# Patient Record
Sex: Female | Born: 1985 | Hispanic: Yes | Marital: Single | Smoking: Never smoker
Health system: Southern US, Community
[De-identification: ages and names within clinical notes are randomized; demographics above are authoritative.]

## PROBLEM LIST (undated history)

## (undated) DIAGNOSIS — I729 Aneurysm of unspecified site: Secondary | ICD-10-CM

---

## 2016-11-08 DIAGNOSIS — N8000 Endometriosis of the uterus, unspecified: Secondary | ICD-10-CM | POA: Insufficient documentation

## 2020-05-30 DIAGNOSIS — Z0189 Encounter for other specified special examinations: Secondary | ICD-10-CM | POA: Diagnosis not present

## 2020-05-30 DIAGNOSIS — N800 Endometriosis of uterus: Secondary | ICD-10-CM | POA: Diagnosis not present

## 2020-05-30 DIAGNOSIS — Z124 Encounter for screening for malignant neoplasm of cervix: Secondary | ICD-10-CM | POA: Diagnosis not present

## 2020-08-05 DIAGNOSIS — Z309 Encounter for contraceptive management, unspecified: Secondary | ICD-10-CM | POA: Diagnosis not present

## 2020-08-05 DIAGNOSIS — R102 Pelvic and perineal pain: Secondary | ICD-10-CM | POA: Diagnosis not present

## 2020-08-25 DIAGNOSIS — H5213 Myopia, bilateral: Secondary | ICD-10-CM | POA: Diagnosis not present

## 2020-12-17 ENCOUNTER — Emergency Department (HOSPITAL_COMMUNITY)
Admission: EM | Admit: 2020-12-17 | Discharge: 2020-12-17 | Disposition: A | Discharge status: Home / Self Care | Payer: BC Managed Care – PPO | Attending: Emergency Medicine | Admitting: Emergency Medicine

## 2020-12-17 DIAGNOSIS — M545 Low back pain, unspecified: Secondary | ICD-10-CM | POA: Insufficient documentation

## 2020-12-17 DIAGNOSIS — Y92009 Unspecified place in unspecified non-institutional (private) residence as the place of occurrence of the external cause: Secondary | ICD-10-CM | POA: Insufficient documentation

## 2020-12-17 DIAGNOSIS — X500XXA Overexertion from strenuous movement or load, initial encounter: Secondary | ICD-10-CM | POA: Insufficient documentation

## 2020-12-17 LAB — URINALYSIS, ROUTINE W REFLEX MICROSCOPIC
Bilirubin Urine: NEGATIVE
Glucose, UA: NEGATIVE mg/dL
Hgb urine dipstick: NEGATIVE
Ketones, ur: NEGATIVE mg/dL
Leukocytes,Ua: NEGATIVE
Nitrite: NEGATIVE
Protein, ur: NEGATIVE mg/dL
Specific Gravity, Urine: 1.02 (ref 1.005–1.030)
pH: 6.5 (ref 5.0–8.0)

## 2020-12-17 MED ORDER — TIZANIDINE HCL 2 MG PO CAPS: 2.0000 mg | ORAL_CAPSULE | Freq: Three times a day (TID) | ORAL | 0 refills | Status: DC

## 2020-12-17 MED ORDER — IBUPROFEN 400 MG PO TABS: 400.0000 mg | ORAL_TABLET | Freq: Three times a day (TID) | ORAL | 0 refills | Status: AC

## 2020-12-17 MED ORDER — KETOROLAC TROMETHAMINE 30 MG/ML IJ SOLN
15.0000 mg | Freq: Once | INTRAMUSCULAR | Status: AC
  Administered 2020-12-17: 15 mg via INTRAMUSCULAR
  Filled 2020-12-17: qty 1

## 2020-12-17 MED ORDER — METHYL SALICYLATE-LIDO-MENTHOL 4-4-5 % EX PTCH: 1.0000 | MEDICATED_PATCH | Freq: Two times a day (BID) | CUTANEOUS | 0 refills | Status: DC

## 2020-12-17 NOTE — Discharge Instructions (Signed)
It is important to get plenty of rest, use the provided medication, and monitor your condition.  Do not hesitate to return here, otherwise follow-up with your physician.

## 2020-12-17 NOTE — ED Provider Notes (Signed)
[facility] EMERGENCY DEPARTMENT Provider Note   CSN: [id number] Arrival date & time: 12/17/20  4403     History No chief complaint on file.   Debbie Armstrong is a 35 y.o. female.  HPI Patient presents with back pain.  Pain is across the lower back, left greater than right, has been present for 2+ days.  She notes that the pain began about 48 hours ago, several hours after she was cleaning her house.  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 Since onset pain has been focally lower back, left greater than right, not improved with Tylenol, sore, moderate, worse with activity.  No associated abdominal pain, urinary pattern changes, nausea, vomiting, fever, chills, chest pain, dyspnea. No history of back surgery. Patient has no medical problems, no surgical history, drinks socially, does not smoke.     OB History   No obstetric history on file.     No family history on file.     Home Medications Prior to Admission medications   Not on File    Allergies    Patient has no allergy information on record.  Review of Systems   Review of Systems  Constitutional:       Per HPI, otherwise negative  HENT:       Per HPI, otherwise negative  Respiratory:       Per HPI, otherwise negative  Cardiovascular:       Per HPI, otherwise negative  Gastrointestinal: Negative for vomiting.  Endocrine:       Negative aside from HPI  Genitourinary:       Neg aside from HPI   Musculoskeletal:       Per HPI, otherwise negative  Skin: Negative.   Neurological: Negative for syncope.    Physical Exam Updated Vital Signs BP 118/71 (BP Location: Right Arm)   Pulse (!) 56   Temp 97.8 F (36.6 C) (Oral)   Resp 17   Ht 5\' 8"  (1.727 m)   Wt 90.7 kg   SpO2 99%   BMI 30.41 kg/m   Physical Exam Vitals and nursing note reviewed.  Constitutional:      General: She is not in acute distress.    Appearance:  She is well-developed.  HENT:     Head: Normocephalic and atraumatic.  Eyes:     Conjunctiva/sclera: Conjunctivae normal.  Cardiovascular:     Rate and Rhythm: Normal rate and regular rhythm.  Pulmonary:     Effort: Pulmonary effort is normal. No respiratory distress.     Breath sounds: Normal breath sounds. No stridor.  Abdominal:     General: There is no distension.  Musculoskeletal:       Arms:     Comments: Patient moves all extremity spontaneously, has no referred pain with flexion of either hip against resistance.  Skin:    General: Skin is warm and dry.  Neurological:     Mental Status: She is alert and oriented to person, place, and time.     Cranial Nerves: No cranial nerve deficit.     ED Results / Procedures / Treatments   Labs (all labs ordered are listed, but only abnormal results are displayed) Labs Reviewed  URINALYSIS, ROUTINE W REFLEX MICROSCOPIC    EKG None  Radiology No results found.  Procedures Procedures   Medications Ordered in ED Medications  ketorolac (TORADOL) 30 MG/ML injection 15 mg (has no administration in time range)  ED Course  I have reviewed the triage vital signs and the nursing notes.  Pertinent labs & imaging results that were available during my care of the patient were reviewed by me and considered in my medical decision making (see chart for details).   Urinalysis unremarkable. MDM Rules/Calculators/A&P Adult female presents with 2 days of low back pain, no focal neurologic deficiencies, no red flags suggesting CNS pathology or cauda equina.  She is awake, alert, afebrile.  Patient started on anti-inflammatories here, discharged with similar as well as muscle relaxants, and a work note. Final Clinical Impression(s) / ED Diagnoses Final diagnoses:  Acute bilateral low back pain without sciatica    Rx / DC Orders ED Discharge Orders         Ordered    ibuprofen (ADVIL) 400 MG tablet  3 times daily        12/17/20  1230    tizanidine (ZANAFLEX) 2 MG capsule  3 times daily        12/17/20 1230    Methyl Salicylate-Lido-Menthol 4-4-5 % PTCH  2 times daily        12/17/20 1230           Gerhard Munch, MD 12/17/20 1231

## 2020-12-17 NOTE — ED Triage Notes (Signed)
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

## 2020-12-18 ENCOUNTER — Telehealth: Payer: Self-pay | Admitting: *Deleted

## 2020-12-18 NOTE — Telephone Encounter (Signed)
Transition Care Management Unsuccessful Follow-up Telephone Call  Date of discharge and from where:  12/17/2020 - [facility]  Attempts:  1st Attempt  Reason for unsuccessful TCM follow-up call:  Left voice message 

## 2020-12-19 NOTE — Telephone Encounter (Signed)
Transition Care Management Unsuccessful Follow-up Telephone Call  Date of discharge and from where:  12/17/2020 from [facility]  Attempts:  2nd Attempt  Reason for unsuccessful TCM follow-up call:  Left voice message

## 2020-12-22 NOTE — Telephone Encounter (Signed)
Transition Care Management Unsuccessful Follow-up Telephone Call  Date of discharge and from where:  12/17/2020 from [facility]  Attempts:  3rd Attempt  Reason for unsuccessful TCM follow-up call:  Unable to reach patient

## 2021-11-08 ENCOUNTER — Emergency Department (HOSPITAL_BASED_OUTPATIENT_CLINIC_OR_DEPARTMENT_OTHER)
Admission: EM | Admit: 2021-11-08 | Discharge: 2021-11-08 | Disposition: A | Discharge status: Home / Self Care | Payer: BC Managed Care – PPO | Source: Home / Self Care

## 2021-11-08 ENCOUNTER — Other Ambulatory Visit: Payer: Self-pay

## 2021-11-08 ENCOUNTER — Emergency Department (HOSPITAL_COMMUNITY): Payer: BC Managed Care – PPO

## 2021-11-08 ENCOUNTER — Emergency Department (HOSPITAL_COMMUNITY): Payer: BC Managed Care – PPO | Admitting: Anesthesiology

## 2021-11-08 ENCOUNTER — Inpatient Hospital Stay (HOSPITAL_COMMUNITY)
Admission: EM | Admit: 2021-11-08 | Discharge: 2021-11-25 | DRG: 022 | Disposition: A | Discharge status: Home / Self Care | Payer: BC Managed Care – PPO | Attending: Neurosurgery | Admitting: Neurosurgery

## 2021-11-08 ENCOUNTER — Encounter (HOSPITAL_COMMUNITY)
Admission: EM | Disposition: A | Discharge status: Home / Self Care | Payer: Self-pay | Source: Home / Self Care | Attending: Neurosurgery

## 2021-11-08 ENCOUNTER — Inpatient Hospital Stay (HOSPITAL_COMMUNITY): Payer: BC Managed Care – PPO

## 2021-11-08 DIAGNOSIS — I671 Cerebral aneurysm, nonruptured: Secondary | ICD-10-CM | POA: Diagnosis not present

## 2021-11-08 DIAGNOSIS — Z79899 Other long term (current) drug therapy: Secondary | ICD-10-CM

## 2021-11-08 DIAGNOSIS — I607 Nontraumatic subarachnoid hemorrhage from unspecified intracranial artery: Secondary | ICD-10-CM | POA: Diagnosis not present

## 2021-11-08 DIAGNOSIS — Z5321 Procedure and treatment not carried out due to patient leaving prior to being seen by health care provider: Secondary | ICD-10-CM | POA: Insufficient documentation

## 2021-11-08 DIAGNOSIS — I609 Nontraumatic subarachnoid hemorrhage, unspecified: Secondary | ICD-10-CM | POA: Diagnosis not present

## 2021-11-08 DIAGNOSIS — I10 Essential (primary) hypertension: Secondary | ICD-10-CM | POA: Diagnosis not present

## 2021-11-08 DIAGNOSIS — I615 Nontraumatic intracerebral hemorrhage, intraventricular: Secondary | ICD-10-CM | POA: Diagnosis present

## 2021-11-08 DIAGNOSIS — I602 Nontraumatic subarachnoid hemorrhage from anterior communicating artery: Principal | ICD-10-CM | POA: Diagnosis present

## 2021-11-08 DIAGNOSIS — I608 Other nontraumatic subarachnoid hemorrhage: Secondary | ICD-10-CM

## 2021-11-08 DIAGNOSIS — H5371 Glare sensitivity: Secondary | ICD-10-CM | POA: Insufficient documentation

## 2021-11-08 DIAGNOSIS — R112 Nausea with vomiting, unspecified: Secondary | ICD-10-CM | POA: Insufficient documentation

## 2021-11-08 DIAGNOSIS — G4489 Other headache syndrome: Secondary | ICD-10-CM | POA: Diagnosis not present

## 2021-11-08 DIAGNOSIS — G43909 Migraine, unspecified, not intractable, without status migrainosus: Secondary | ICD-10-CM | POA: Diagnosis not present

## 2021-11-08 DIAGNOSIS — R519 Headache, unspecified: Secondary | ICD-10-CM | POA: Insufficient documentation

## 2021-11-08 HISTORY — PX: IR US GUIDE VASC ACCESS RIGHT: IMG2390

## 2021-11-08 HISTORY — PX: RADIOLOGY WITH ANESTHESIA: SHX6223

## 2021-11-08 HISTORY — PX: IR TRANSCATH/EMBOLIZ: IMG695

## 2021-11-08 HISTORY — PX: IR 3D INDEPENDENT WKST: IMG2385

## 2021-11-08 HISTORY — PX: IR ANGIO INTRA EXTRACRAN SEL INTERNAL CAROTID BILAT MOD SED: IMG5363

## 2021-11-08 HISTORY — PX: IR ANGIOGRAM FOLLOW UP STUDY: IMG697

## 2021-11-08 HISTORY — PX: IR NEURO EACH ADD'L AFTER BASIC UNI RIGHT (MS): IMG5374

## 2021-11-08 HISTORY — PX: IR ANGIO VERTEBRAL SEL VERTEBRAL UNI L MOD SED: IMG5367

## 2021-11-08 LAB — RAPID URINE DRUG SCREEN, HOSP PERFORMED
Amphetamines: NOT DETECTED
Barbiturates: NOT DETECTED
Benzodiazepines: NOT DETECTED
Cocaine: NOT DETECTED
Opiates: NOT DETECTED
Tetrahydrocannabinol: POSITIVE — AB

## 2021-11-08 LAB — DIFFERENTIAL
Abs Immature Granulocytes: 0.09 10*3/uL — ABNORMAL HIGH (ref 0.00–0.07)
Basophils Absolute: 0 10*3/uL (ref 0.0–0.1)
Basophils Relative: 0 %
Eosinophils Absolute: 0 10*3/uL (ref 0.0–0.5)
Eosinophils Relative: 0 %
Immature Granulocytes: 1 %
Lymphocytes Relative: 3 %
Lymphs Abs: 0.6 10*3/uL — ABNORMAL LOW (ref 0.7–4.0)
Monocytes Absolute: 0.2 10*3/uL (ref 0.1–1.0)
Monocytes Relative: 1 %
Neutro Abs: 17.2 10*3/uL — ABNORMAL HIGH (ref 1.7–7.7)
Neutrophils Relative %: 95 %

## 2021-11-08 LAB — CBC
HCT: 41.5 % (ref 36.0–46.0)
HCT: 42.9 % (ref 36.0–46.0)
Hemoglobin: 13.6 g/dL (ref 12.0–15.0)
Hemoglobin: 13.8 g/dL (ref 12.0–15.0)
MCH: 30.8 pg (ref 26.0–34.0)
MCH: 30.5 pg (ref 26.0–34.0)
MCHC: 32.8 g/dL (ref 30.0–36.0)
MCHC: 32.2 g/dL (ref 30.0–36.0)
MCV: 93.9 fL (ref 80.0–100.0)
MCV: 94.7 fL (ref 80.0–100.0)
Platelets: 245 10*3/uL (ref 150–400)
Platelets: 264 10*3/uL (ref 150–400)
RBC: 4.42 MIL/uL (ref 3.87–5.11)
RBC: 4.53 MIL/uL (ref 3.87–5.11)
RDW: 12.1 % (ref 11.5–15.5)
RDW: 12.3 % (ref 11.5–15.5)
WBC: 15.7 10*3/uL — ABNORMAL HIGH (ref 4.0–10.5)
WBC: 18.1 10*3/uL — ABNORMAL HIGH (ref 4.0–10.5)
nRBC: 0 % (ref 0.0–0.2)
nRBC: 0 % (ref 0.0–0.2)

## 2021-11-08 LAB — PROTIME-INR
INR: 1.1 (ref 0.8–1.2)
Prothrombin Time: 13.7 seconds (ref 11.4–15.2)

## 2021-11-08 LAB — BASIC METABOLIC PANEL
Anion gap: 8 (ref 5–15)
BUN: 11 mg/dL (ref 6–20)
CO2: 20 mmol/L — ABNORMAL LOW (ref 22–32)
Calcium: 8.5 mg/dL — ABNORMAL LOW (ref 8.9–10.3)
Chloride: 110 mmol/L (ref 98–111)
Creatinine, Ser: 0.68 mg/dL (ref 0.44–1.00)
GFR, Estimated: >60 mL/min (ref >60)
Glucose, Bld: 115 mg/dL — ABNORMAL HIGH (ref 70–99)
Potassium: 3.2 mmol/L — ABNORMAL LOW (ref 3.5–5.1)
Sodium: 138 mmol/L (ref 135–145)

## 2021-11-08 LAB — I-STAT BETA HCG BLOOD, ED (MC, WL, AP ONLY): I-stat hCG, quantitative: <5 m[IU]/mL (ref <5)

## 2021-11-08 LAB — HIV ANTIBODY (ROUTINE TESTING W REFLEX): HIV Screen 4th Generation wRfx: NONREACTIVE

## 2021-11-08 LAB — APTT: aPTT: 24 seconds (ref 24–36)

## 2021-11-08 SURGERY — RADIOLOGY WITH ANESTHESIA
Anesthesia: General

## 2021-11-08 MED ORDER — ASPIRIN 81 MG PO CHEW
81.0000 mg | CHEWABLE_TABLET | Freq: Every day | ORAL | Status: DC
Start: 2021-11-09 — End: 2021-11-25
  Administered 2021-11-09 – 2021-11-25 (×17): 81 mg via ORAL
  Filled 2021-11-08 (×15): qty 1

## 2021-11-08 MED ORDER — HEPARIN SODIUM (PORCINE) 1000 UNIT/ML IJ SOLN
INTRAMUSCULAR | Status: DC | PRN
  Administered 2021-11-08: 3000 [IU] via INTRAVENOUS
  Administered 2021-11-08: 2000 [IU] via INTRAVENOUS

## 2021-11-08 MED ORDER — NIMODIPINE 30 MG PO CAPS
60.0000 mg | ORAL_CAPSULE | ORAL | Status: DC
  Administered 2021-11-08 – 2021-11-25 (×100): 60 mg via ORAL
  Filled 2021-11-08 (×105): qty 2

## 2021-11-08 MED ORDER — SODIUM CHLORIDE 0.9 % IV BOLUS
500.0000 mL | Freq: Once | INTRAVENOUS | Status: AC
  Administered 2021-11-08: 500 mL via INTRAVENOUS

## 2021-11-08 MED ORDER — LIDOCAINE HCL 1 % IJ SOLN
INTRAMUSCULAR | Status: AC
  Filled 2021-11-08: qty 20

## 2021-11-08 MED ORDER — NITROGLYCERIN 1 MG/10 ML FOR IR/CATH LAB
INTRA_ARTERIAL | Status: AC | PRN
  Administered 2021-11-08: 3 mL via INTRA_ARTERIAL

## 2021-11-08 MED ORDER — DEXAMETHASONE SODIUM PHOSPHATE 10 MG/ML IJ SOLN
INTRAMUSCULAR | Status: DC | PRN
  Administered 2021-11-08: 5 mg via INTRAVENOUS

## 2021-11-08 MED ORDER — ROCURONIUM BROMIDE 10 MG/ML (PF) SYRINGE
PREFILLED_SYRINGE | INTRAVENOUS | Status: DC | PRN
  Administered 2021-11-08: 60 mg via INTRAVENOUS
  Administered 2021-11-08 (×2): 20 mg via INTRAVENOUS

## 2021-11-08 MED ORDER — ACETAMINOPHEN 325 MG PO TABS
650.0000 mg | ORAL_TABLET | ORAL | Status: DC | PRN
Start: 2021-11-08 — End: 2021-11-25
  Administered 2021-11-09 – 2021-11-20 (×22): 650 mg via ORAL
  Filled 2021-11-08 (×22): qty 2

## 2021-11-08 MED ORDER — STROKE: EARLY STAGES OF RECOVERY BOOK
Freq: Once | Status: AC
  Filled 2021-11-08: qty 1

## 2021-11-08 MED ORDER — HEPARIN SODIUM (PORCINE) 1000 UNIT/ML IJ SOLN
INTRAMUSCULAR | Status: AC
  Filled 2021-11-08: qty 10

## 2021-11-08 MED ORDER — ONDANSETRON HCL 4 MG/2ML IJ SOLN
4.0000 mg | Freq: Four times a day (QID) | INTRAMUSCULAR | Status: DC | PRN
  Administered 2021-11-09 – 2021-11-13 (×8): 4 mg via INTRAVENOUS
  Filled 2021-11-08 (×5): qty 2

## 2021-11-08 MED ORDER — SUCCINYLCHOLINE CHLORIDE 200 MG/10ML IV SOSY
PREFILLED_SYRINGE | INTRAVENOUS | Status: DC | PRN
  Administered 2021-11-08: 120 mg via INTRAVENOUS

## 2021-11-08 MED ORDER — IOHEXOL 300 MG/ML  SOLN
100.0000 mL | Freq: Once | INTRAMUSCULAR | Status: AC | PRN
Start: 2021-11-08 — End: 2021-11-08
  Administered 2021-11-08: 100 mL via INTRA_ARTERIAL

## 2021-11-08 MED ORDER — PANTOPRAZOLE SODIUM 40 MG PO TBEC
40.0000 mg | DELAYED_RELEASE_TABLET | Freq: Every day | ORAL | Status: DC
  Administered 2021-11-09 – 2021-11-25 (×17): 40 mg via ORAL
  Filled 2021-11-08 (×17): qty 1

## 2021-11-08 MED ORDER — DIPHENHYDRAMINE HCL 50 MG/ML IJ SOLN
25.0000 mg | Freq: Once | INTRAMUSCULAR | Status: AC
  Administered 2021-11-08: 25 mg via INTRAVENOUS
  Filled 2021-11-08: qty 1

## 2021-11-08 MED ORDER — ASPIRIN 81 MG PO CHEW
81.0000 mg | CHEWABLE_TABLET | Freq: Every day | ORAL | Status: DC
  Filled 2021-11-08 (×2): qty 1

## 2021-11-08 MED ORDER — ACETAMINOPHEN 160 MG/5ML PO SOLN: 650.0000 mg | ORAL | Status: DC | PRN

## 2021-11-08 MED ORDER — LACTATED RINGERS IV SOLN: INTRAVENOUS | Status: DC | PRN

## 2021-11-08 MED ORDER — NITROGLYCERIN 1 MG/10 ML FOR IR/CATH LAB
INTRA_ARTERIAL | Status: AC
  Filled 2021-11-08: qty 10

## 2021-11-08 MED ORDER — SUGAMMADEX SODIUM 200 MG/2ML IV SOLN
INTRAVENOUS | Status: DC | PRN
  Administered 2021-11-08: 50 mg via INTRAVENOUS
  Administered 2021-11-08 (×2): 100 mg via INTRAVENOUS

## 2021-11-08 MED ORDER — CLEVIDIPINE BUTYRATE 0.5 MG/ML IV EMUL
0.0000 mg/h | INTRAVENOUS | Status: DC
  Administered 2021-11-08: 8 mg/h via INTRAVENOUS
  Filled 2021-11-08: qty 50

## 2021-11-08 MED ORDER — FENTANYL CITRATE (PF) 100 MCG/2ML IJ SOLN
INTRAMUSCULAR | Status: AC
  Filled 2021-11-08: qty 2

## 2021-11-08 MED ORDER — ACETAMINOPHEN 650 MG RE SUPP: 650.0000 mg | RECTAL | Status: DC | PRN

## 2021-11-08 MED ORDER — VERAPAMIL HCL 2.5 MG/ML IV SOLN
INTRAVENOUS | Status: AC
Start: 2021-11-08 — End: 2021-11-09
  Filled 2021-11-08: qty 2

## 2021-11-08 MED ORDER — ONDANSETRON HCL 4 MG/2ML IJ SOLN
INTRAMUSCULAR | Status: AC
  Administered 2021-11-08: 4 mg via INTRAVENOUS
  Filled 2021-11-08: qty 2

## 2021-11-08 MED ORDER — ONDANSETRON HCL 4 MG/2ML IJ SOLN
4.0000 mg | Freq: Four times a day (QID) | INTRAMUSCULAR | Status: DC | PRN
  Administered 2021-11-13 (×2): 4 mg via INTRAVENOUS
  Filled 2021-11-08 (×6): qty 2

## 2021-11-08 MED ORDER — PANTOPRAZOLE 2 MG/ML SUSPENSION
40.0000 mg | Freq: Every day | ORAL | Status: DC
  Filled 2021-11-08 (×2): qty 20

## 2021-11-08 MED ORDER — METOCLOPRAMIDE HCL 5 MG/ML IJ SOLN
10.0000 mg | Freq: Once | INTRAMUSCULAR | Status: AC
  Administered 2021-11-08: 10 mg via INTRAVENOUS
  Filled 2021-11-08: qty 2

## 2021-11-08 MED ORDER — ONDANSETRON HCL 4 MG/2ML IJ SOLN: 4.0000 mg | Freq: Once | INTRAMUSCULAR | Status: AC

## 2021-11-08 MED ORDER — NITROGLYCERIN 1 MG/10 ML FOR IR/CATH LAB
INTRA_ARTERIAL | Status: AC | PRN
  Administered 2021-11-08: 200 ug

## 2021-11-08 MED ORDER — ACETAMINOPHEN 325 MG PO TABS: 650.0000 mg | ORAL_TABLET | ORAL | Status: DC | PRN

## 2021-11-08 MED ORDER — ACETAMINOPHEN-CODEINE #3 300-30 MG PO TABS
1.0000 | ORAL_TABLET | ORAL | Status: DC | PRN
  Administered 2021-11-15 – 2021-11-19 (×6): 2 via ORAL
  Filled 2021-11-08 (×6): qty 2

## 2021-11-08 MED ORDER — PROPOFOL 10 MG/ML IV BOLUS
INTRAVENOUS | Status: DC | PRN
  Administered 2021-11-08: 160 mg via INTRAVENOUS
  Administered 2021-11-08: 40 mg via INTRAVENOUS
  Administered 2021-11-08: 50 mg via INTRAVENOUS

## 2021-11-08 MED ORDER — ONDANSETRON HCL 4 MG/2ML IJ SOLN
INTRAMUSCULAR | Status: DC | PRN
  Administered 2021-11-08: 4 mg via INTRAVENOUS

## 2021-11-08 MED ORDER — NIMODIPINE 6 MG/ML PO SOLN
60.0000 mg | ORAL | Status: DC
  Filled 2021-11-08 (×44): qty 10

## 2021-11-08 MED ORDER — SODIUM CHLORIDE 0.9 % IV SOLN: INTRAVENOUS | Status: DC

## 2021-11-08 MED ORDER — IOHEXOL 350 MG/ML SOLN
100.0000 mL | Freq: Once | INTRAVENOUS | Status: AC | PRN
  Administered 2021-11-08: 100 mL via INTRAVENOUS

## 2021-11-08 MED ORDER — CHLORHEXIDINE GLUCONATE CLOTH 2 % EX PADS
6.0000 | MEDICATED_PAD | Freq: Every day | CUTANEOUS | Status: DC
  Administered 2021-11-09 – 2021-11-20 (×11): 6 via TOPICAL

## 2021-11-08 MED ORDER — IOHEXOL 300 MG/ML  SOLN
100.0000 mL | Freq: Once | INTRAMUSCULAR | Status: AC | PRN
  Administered 2021-11-08: 40 mL via INTRA_ARTERIAL

## 2021-11-08 MED ORDER — HYDROMORPHONE HCL 1 MG/ML IJ SOLN
0.5000 mg | INTRAMUSCULAR | Status: DC | PRN
  Administered 2021-11-09 – 2021-11-16 (×3): 0.5 mg via INTRAVENOUS
  Filled 2021-11-08 (×3): qty 1

## 2021-11-08 MED ORDER — CLEVIDIPINE BUTYRATE 0.5 MG/ML IV EMUL
INTRAVENOUS | Status: AC
  Filled 2021-11-08: qty 50

## 2021-11-08 MED ORDER — LABETALOL HCL 5 MG/ML IV SOLN: 20.0000 mg | Freq: Once | INTRAVENOUS | Status: DC

## 2021-11-08 MED ORDER — ONDANSETRON 4 MG PO TBDP: 4.0000 mg | ORAL_TABLET | Freq: Four times a day (QID) | ORAL | Status: DC | PRN

## 2021-11-08 MED ORDER — LIDOCAINE 2% (20 MG/ML) 5 ML SYRINGE
INTRAMUSCULAR | Status: DC | PRN
  Administered 2021-11-08: 60 mg via INTRAVENOUS

## 2021-11-08 MED ORDER — FENTANYL CITRATE (PF) 250 MCG/5ML IJ SOLN
INTRAMUSCULAR | Status: DC | PRN
  Administered 2021-11-08: 100 ug via INTRAVENOUS

## 2021-11-08 MED ORDER — CLEVIDIPINE BUTYRATE 0.5 MG/ML IV EMUL
0.0000 mg/h | INTRAVENOUS | Status: DC
  Administered 2021-11-08: 4 mg/h via INTRAVENOUS

## 2021-11-08 MED ORDER — DOCUSATE SODIUM 100 MG PO CAPS
100.0000 mg | ORAL_CAPSULE | Freq: Two times a day (BID) | ORAL | Status: DC
  Administered 2021-11-09 – 2021-11-24 (×18): 100 mg via ORAL
  Filled 2021-11-08 (×22): qty 1

## 2021-11-08 NOTE — ED Provider Notes (Signed)
?[name] EMERGENCY DEPARTMENT ?Provider Note ? ? ?CSN: [id number] ?Arrival date & time: 11/08/21  1215 ? ?  ? ?History ? ?Chief Complaint  ?Patient presents with  ? Migraine  ? ? ?Debbie Armstrong is a 36 y.o. female. ? ?Patient presents to the emergency department today for evaluation of headache.  Patient states that the headache started around 11 AM.  She states that she just used the restroom and took a hit from a vape when she developed abrupt onset of severe pain on the top of her head which then generalized.  This has been associated with vomiting.  She has had blurry vision but no loss of vision.  Pain radiates into her neck.  No weakness, numbness or tingling in her arms or legs and she has been able to walk.  Patient was brought to [facility] ED by EMS but left before being seen and was transported by a friend to the [facility] emergency department.  She has never had a headache like this before.  She is sensitive light and sound.  No fevers or confusion. ? ? ?  ? ?Home Medications ?Prior to Admission medications   ?Medication Sig Start Date End Date Taking? Authorizing Provider  ?Methyl Salicylate-Lido-Menthol 4-4-5 % PTCH Apply 1 patch topically in the morning and at bedtime. 12/17/20   Gerhard Munch, MD  ?tizanidine (ZANAFLEX) 2 MG capsule Take 1 capsule (2 mg total) by mouth 3 (three) times daily. 12/17/20   Gerhard Munch, MD  ?   ? ?Allergies    ?Patient has no allergy information on record.   ? ?Review of Systems   ?Review of Systems ? ?Physical Exam ?Updated Vital Signs ?BP 131/89   Pulse 72   Temp 97.6 ?F (36.4 ?C) (Oral)   Resp (!) 24   SpO2 100%  ?Physical Exam ?Vitals and nursing note reviewed.  ?Constitutional:   ?   General: She is in acute distress.  ?   Appearance: She is well-developed.  ?   Comments: Patient crying, appears uncomfortable  ?HENT:  ?   Head: Normocephalic and atraumatic.  ?   Right Ear: Tympanic membrane, ear canal and external ear normal.  ?   Left  Ear: Tympanic membrane, ear canal and external ear normal.  ?   Nose: Nose normal.  ?   Mouth/Throat:  ?   Pharynx: Uvula midline.  ?Eyes:  ?   General: Lids are normal.  ?   Extraocular Movements:  ?   Right eye: No nystagmus.  ?   Left eye: No nystagmus.  ?   Conjunctiva/sclera: Conjunctivae normal.  ?   Pupils: Pupils are equal, round, and reactive to light.  ?Cardiovascular:  ?   Rate and Rhythm: Normal rate and regular rhythm.  ?Pulmonary:  ?   Effort: Pulmonary effort is normal.  ?   Breath sounds: Normal breath sounds.  ?Abdominal:  ?   Palpations: Abdomen is soft.  ?   Tenderness: There is no abdominal tenderness.  ?Musculoskeletal:  ?   Cervical back: Normal range of motion and neck supple. No tenderness or bony tenderness.  ?Skin: ?   General: Skin is warm and dry.  ?Neurological:  ?   Mental Status: She is alert and oriented to person, place, and time.  ?   GCS: GCS eye subscore is 4. GCS verbal subscore is 5. GCS motor subscore is 6.  ?   Cranial Nerves: No cranial nerve deficit.  ?   Sensory: No  sensory deficit.  ?   Motor: No weakness.  ?   Gait: Abnormal gait: Deferred due to pain.  ?   Comments: Patient moves her extremities purposefully, no gross upper or lower extremity weakness. ?  ? ? ?ED Results / Procedures / Treatments   ?Labs ?(all labs ordered are listed, but only abnormal results are displayed) ?Labs Reviewed  ?CBC - Abnormal; Notable for the following components:  ?    Result Value  ? WBC 15.7 (*)   ? All other components within normal limits  ?BASIC METABOLIC PANEL  ?I-STAT BETA HCG BLOOD, ED ([facility], [facility], [facility] ONLY)  ? ? ?EKG ?None ? ?Radiology ?CT HEAD WO CONTRAST (5MM) ? ?Result Date: 11/08/2021 ?CLINICAL DATA:  Headache, severe headache and vomiting. EXAM: CT HEAD WITHOUT CONTRAST TECHNIQUE: Contiguous axial images were obtained from the base of the skull through the vertex without intravenous contrast. RADIATION DOSE REDUCTION: This exam was performed according to the departmental  dose-optimization program which includes automated exposure control, adjustment of the mA and/or kV according to patient size and/or use of iterative reconstruction technique. COMPARISON:  None. FINDINGS: Brain: Acute subarachnoid hemorrhage extending from the suprasellar cistern to the bilateral sylvian fissures and along the anterior falx, most dense at the suprasellar cistern suggesting aneurysm rupture. Small amount of additional acute hemorrhage within the third and fourth ventricle. No parenchymal hemorrhage or parenchymal edema is identified. 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 Skull: Normal. Negative for fracture or focal lesion. Sinuses/Orbits: No acute finding. Other: None. IMPRESSION: 1. 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 Small amount of additional acute hemorrhage within the third and fourth ventricle. Recommend immediate neurosurgery consultation and CT angiogram for more definitive characterization of the intracranial vasculature. 2. No parenchymal hemorrhage or parenchymal edema is identified. Critical Value/emergent results were called by telephone at the time of interpretation on 11/08/2021 at 1:28 pm to provider [facility] [place] [facility] , who verbally acknowledged these results. Electronically Signed   By: Bary RichardStan  Maynard M.D.   On: 11/08/2021 13:33   ? ?Procedures ?Procedures  ? ? ?Medications Ordered in ED ?Medications  ?metoCLOPramide (REGLAN) injection 10 mg (10 mg Intravenous Given 11/08/21 1244)  ?diphenhydrAMINE (BENADRYL) injection 25 mg (25 mg Intravenous Given 11/08/21 1244)  ?sodium chloride 0.9 % bolus 500 mL (500 mLs Intravenous New Bag/Given 11/08/21 1243)  ? ? ?ED Course/ Medical Decision Making/ A&P ?  ? ?Patient seen and examined on arrival. History obtained directly from patient and another person at bedside  who provides time of onset.  Patient will need CT imaging of the head to evaluate for subarachnoid hemorrhage given abrupt onset of her symptoms, atypical headache. ? ?Labs/EKG: None ordered ? ?Imaging: CT head without contrast ? ?Medications/Fluids: Ordered: Reglan for headache and vomiting, Benadryl, IV fluid bolus ? ?Most recent vital signs reviewed and are as follows: ?BP 131/89   Pulse 72   Temp 97.6 ?F (36.4 ?C) (Oral)   Resp (!) 24   SpO2 100%  ? ?Initial impression: Headache, no focal neuro findings ? ?1:30 PM Notified by radiology regarding subarachnoid hemorrhage, likely aneurysmal.  ? ?1:35 PM patient updated on results and discussed plan.  On reexam, she is a little drowsy, possibly due to Reglan/Benadryl, but her exam is stable.  No focal motor deficits or sensory deficits.  Cranial nerves remain grossly intact. ? ?1:44 PM Consulted  with Dr. Franky Macho of neurosurgery by telephone. He is aware of patient. Discussed plan to obtain CT angio.  ? ?1:55 PM Pt reassessed. Exam stable.  ? ?2:20 PM Pt rechecked, just started vomiting. Zofran order to RN.  ? ?2:36 PM Vomiting improved, pt resting. Cbc with elevated WBC count noted. BP still controlled.  ? ?BP 134/82   Pulse (!) 58   Temp 97.6 ?F (36.4 ?C) (Oral)   Resp 14   Ht 5\' 6"  (1.676 m)   Wt 93 kg   SpO2 100%   BMI 33.09 kg/m?  ? ?3:42 PM Patient returned from CT. Reviewed results and imaging. Pt reassessed. She is stable, awake and alert, no changes.  ? ?Dr. aware of patient at shift change.  ? ?CRITICAL CARE ?Performed by: Lockie Mola PA-C ?Total critical care time: 55 minutes ?Critical care time was exclusive of separately billable procedures and treating other patients. ?Critical care was necessary to treat or prevent imminent or life-threatening deterioration. ?Critical care was time spent personally by me on the following activities: development of treatment plan with patient and/or surrogate as well as nursing, discussions with  consultants, evaluation of patient's response to treatment, examination of patient, obtaining history from patient or surrogate, ordering and performing treatments and interventions, ordering and review of laboratory studies, orde

## 2021-11-08 NOTE — Anesthesia Procedure Notes (Signed)
Arterial Line Insertion ?Start/End4/04/2022 5:18 PM, 11/08/2021 5:25 PM ?Performed by: Val Eagle, MD, anesthesiologist ? Patient location: [facility] procedure area. ?Preanesthetic checklist: patient identified, IV checked, site marked, risks and benefits discussed, surgical consent, monitors and equipment checked, pre-op evaluation, timeout performed and anesthesia consent ?Lidocaine 1% used for infiltration ?Left, radial was placed ?Catheter size: 20 G ?Hand hygiene performed  and maximum sterile barriers used  ? ?Attempts: 1 ?Procedure performed without using ultrasound guided technique. ?Following insertion, dressing applied and Biopatch. ?Post procedure assessment: normal and unchanged ? ?Patient tolerated the procedure well with no immediate complications. ? ? ?

## 2021-11-08 NOTE — Consult Note (Signed)
? ?Chief Complaint: Patient was seen in consultation today for Subarachnoid hemorrhage. ? ?Referring Physician(s): Coletta Memos, MD. ? ?Supervising Physician: Baldemar Lenis ? ?Patient Status: [facility] - In-pt ? ?History of Present Illness: ?Debbie Armstrong is a 36 y.o. female with no significant past medical history presenting with sudden onset headache that started around 11 am today. She was vaping and felt a strong headache that extended to the neck. She said she felt a bit paralysed for a moment, but this resolved while the headache persisted. Head CT showed SAH in the basal cisterns with 4th ventricle IVH. 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 ? ? ?Allergies: ?Patient has no known allergies. ? ?Medications: ?Prior to Admission medications   ?Medication Sig Start Date End Date Taking? Authorizing Provider  ?Multiple Vitamins-Minerals (HAIR SKIN & NAILS ADVANCED) TABS Take 1 tablet by mouth daily.   Yes [provider]  ?  ? ?No family history on file. ? ?Social History  ? ?Socioeconomic History  ? Marital status: Single  ?  Spouse name: Not on file  ? Number of children: Not on file  ? Years of education: Not on file  ? Highest education level: Not on file  ?Occupational History  ? Not on file  ?Tobacco Use  ? Smoking status: Not on file  ? Smokeless tobacco: Not on file  ?Substance and Sexual Activity  ? Alcohol use: Not on file  ? Drug use: Not on file  ? Sexual activity: Not on file  ?Other Topics Concern  ? Not on file  ?Social History Narrative  ? Not on file  ? ?Social Determinants of Health  ? ?Financial Resource Strain: Not on file  ?Food Insecurity: Not on file  ?Transportation Needs: Not on file  ?Physical Activity: Not on file  ?Stress: Not on file  ?Social Connections: Not on file  ? ? ? ?Review of Systems: A 12 point ROS discussed and pertinent positives are indicated in the HPI above.  All other systems are negative. ? ?Review of Systems ? ?Vital  Signs: ?BP (!) 144/80   Pulse (!) 57   Temp 97.6 ?F (36.4 ?C) (Oral)   Resp 19   Ht 5\' 6"  (1.676 m)   Wt 93 kg   SpO2 100%   BMI 33.09 kg/m?  ? ?Physical Exam ?Constitutional:   ?   General: She is sleeping.  ?HENT:  ?   Head: Normocephalic and atraumatic.  ?Eyes:  ?   Extraocular Movements: Extraocular movements intact.  ?   Pupils: Pupils are equal, round, and reactive to light.  ?Cardiovascular:  ?   Rate and Rhythm: Normal rate and regular rhythm.  ?Pulmonary:  ?   Effort: Pulmonary effort is normal.  ?   Breath sounds: Normal breath sounds.  ?Abdominal:  ?   General: Abdomen is flat.  ?   Palpations: Abdomen is soft.  ?Neurological:  ?   General: No focal deficit present.  ?   Mental Status: She is oriented to person, place, and time and easily aroused.  ? ? ? ?  ? ? ?Imaging: ?CT Angio Head W or Wo Contrast ? ?Result Date: 11/08/2021 ?CLINICAL DATA:  Acute subarachnoid hemorrhage, aneurysmal pattern. EXAM: CT ANGIOGRAPHY HEAD TECHNIQUE: Multidetector CT imaging of the head was performed using the standard protocol during bolus administration of intravenous contrast. Multiplanar CT image reconstructions and MIPs were obtained to evaluate the vascular anatomy. RADIATION DOSE REDUCTION: This exam was performed according to the departmental dose-optimization  program which includes automated exposure control, adjustment of the mA and/or kV according to patient size and/or use of iterative reconstruction technique. CONTRAST:  100mL OMNIPAQUE IOHEXOL 350 MG/ML SOLN COMPARISON:  Head CT earlier same day. FINDINGS: CTA HEAD Anterior circulation: The right cervical internal carotid artery is widely patent through the skull base and siphon regions. 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 It appears that the left cervical ICA supplies the left posterior cerebral artery and may give a tiny  contribution to the left anterior and middle cerebral vessels, but that the majority of the flow to the left anterior circulation comes from the right carotid circulation through the anterior communicating artery complex. There appears to be an irregular aneurysm at the anterior communicating artery, difficult to measure precisely, but probably on the order of 4 mm in size. Posterior circulation: Both vertebral arteries are patent through the foramen magnum to the basilar. The left is dominant. The basilar artery is a small vessel that supplies the superior cerebellar arteries and give some contribution to the right PCA. Minimal if any contribution to the left PCA. Venous sinuses: Patent and normal. Anatomic variants: None other significant. Review of the MIP images confirms the above findings. IMPRESSION: Unusual baseline vascular anatomy. A small left internal carotid artery supplies the left posterior cerebral artery and gives a tiny contribution to the left anterior and middle cerebral vessels. 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 Catheter angiography may be necessary to understand the exact details of this vascular anatomy. Electronically Signed   By: Paulina FusiMark  Shogry M.D.   On: 11/08/2021 15:37  ? ?CT HEAD WO CONTRAST (5MM) ? ?Result Date: 11/08/2021 ?CLINICAL DATA:  Headache, severe headache and vomiting. EXAM: CT HEAD WITHOUT CONTRAST TECHNIQUE: Contiguous axial images were obtained from the base of the skull through the vertex without intravenous contrast. RADIATION DOSE REDUCTION: This exam was performed according to the departmental dose-optimization program which includes automated exposure control, adjustment of the mA and/or kV according to patient size and/or use of iterative reconstruction technique. COMPARISON:  None. FINDINGS: Brain: Acute  subarachnoid hemorrhage extending from the suprasellar cistern to the bilateral sylvian fissures and along the anterior falx, most dense at the suprasellar cistern suggesting aneurysm rupture. Small amount of additional acute hemorrhage within the third and fourth ventricle. No parenchymal hemorrhage or parenchymal edema is identified. 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 Skull: Normal. Negative for fracture or focal lesion. Sinuses/Orbits: No acute finding. Other: None. IMPRESSION: 1. 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 Small amount of additional acute hemorrhage within the third and fourth ventricle. Recommend immediate neurosurgery consultation and CT angiogram for more definitive characterization of the intracranial vasculature. 2. No parenchymal hemorrhage or parenchymal edema is identified. Critical Value/emergent results were called by telephone at the time of interpretation on 11/08/2021 at 1:28 pm to provider [facility] , who verbally acknowledged these results. Electronically Signed   By: Bary RichardStan  Maynard M.D.   On: 11/08/2021 13:33   ? ?Labs: ? ?CBC: ?Recent Labs  ?  11/08/21 ?1334  ?WBC 15.7*  ?HGB 13.6  ?HCT 41.5  ?  PLT 245  ? ? ?COAGS: ?No results for input(s): INR, APTT in the last 8760 hours. ? ?BMP: ?Recent Labs  ?  11/08/21 ?1334  ?NA 138  ?K 3.2*  ?CL 110  ?CO2 20*  ?GLUCOSE 115*  ?BUN 11  ?CALCIUM 8.5*  ?CREATININE 0.68  ?GFRNONAA >60  ? ? ?LIVER FUNCTION TESTS: ?No results for input(s): BILITOT, AST, ALT, ALKPHOS, PROT, ALBUMIN in the last 8760 hours. ? ?TUMOR MARKERS: ?No results for input(s): AFPTM, CEA, CA199, CHROMGRNA in the last 8760 hours. ? ?Assessment and Plan: ? ?36 year old female with a HH1, mFS 2 subarachnoid hemorrhage, likely aneurysmal.  Findings of the CT and CT angiogram were explained to  the patient and her sister.  I explained the risks and benefits of a cerebral angiogram and possible endovascular aneurysm treatment.  I also explained that there is a possibility the patient may need open neuro surgical clipping.  Lowella Dandy

## 2021-11-08 NOTE — Anesthesia Postprocedure Evaluation (Signed)
Anesthesia Post Note ? ?Patient: Debbie Armstrong ? ?Procedure(s) Performed: INTERVENTIONAL RADIOLOGY WITH ANESTHESIA ? ?  ? ?Patient location during evaluation: PACU ?Anesthesia Type: General ?Level of consciousness: awake and alert ?Pain management: pain level controlled ?Vital Signs Assessment: post-procedure vital signs reviewed and stable ?Respiratory status: spontaneous breathing, nonlabored ventilation, respiratory function stable and patient connected to nasal cannula oxygen ?Cardiovascular status: blood pressure returned to baseline and stable ?Postop Assessment: no apparent nausea or vomiting ?Anesthetic complications: no ? ? ?No notable events documented. ? ?Last Vitals:  ?Vitals:  ? 11/08/21 2125 11/08/21 2130  ?BP: 117/82   ?Pulse:  94  ?Resp: 16 16  ?Temp: 37.6 ?C   ?SpO2: 100% 100%  ?  ?Last Pain:  ?Vitals:  ? 11/08/21 2125  ?TempSrc:   ?PainSc: 0-No pain  ? ? ?  ?  ?  ?  ?  ?  ? ?Cassandr Cederberg S ? ? ? ? ?

## 2021-11-08 NOTE — Anesthesia Procedure Notes (Addendum)
Procedure Name: Intubation ?Date/Time: 11/08/2021 5:14 PM ?Performed by: Renato Shin, CRNA ?Pre-anesthesia Checklist: Patient identified, Emergency Drugs available, Suction available, Patient being monitored and Timeout performed ?Patient Re-evaluated:Patient Re-evaluated prior to induction ?Oxygen Delivery Method: Circle system utilized ?Preoxygenation: Pre-oxygenation with 100% oxygen ?Induction Type: IV induction, Rapid sequence and Cricoid Pressure applied ?Laryngoscope Size: Sabra Heck and 3 ?Grade View: Grade I ?Tube type: Oral ?Tube size: 7.0 mm ?Number of attempts: 1 ?Airway Equipment and Method: Stylet ?Placement Confirmation: ETT inserted through vocal cords under direct vision, positive ETCO2 and breath sounds checked- equal and bilateral ?Secured at: 22 cm ?Tube secured with: Tape ?Dental Injury: Teeth and Oropharynx as per pre-operative assessment  ? ? ? ? ?

## 2021-11-08 NOTE — Anesthesia Preprocedure Evaluation (Signed)
Anesthesia Evaluation  ?Patient identified by MRN, date of birth, ID band ?Patient awake ? ? ? ?Reviewed: ?Allergy & Precautions, NPO status , Patient's Chart, lab work & pertinent test results ? ?History of Anesthesia Complications ?Negative for: history of anesthetic complications ? ?Airway ?Mallampati: III ? ?TM Distance: >3 FB ?Neck ROM: Full ? ? ? Dental ? ?(+) Teeth Intact, Dental Advisory Given ?  ?Pulmonary ?Current SmokerPatient did not abstain from smoking.,  ?  ?breath sounds clear to auscultation ? ? ? ? ? ? Cardiovascular ?negative cardio ROS ? ? ?Rhythm:Regular  ? ?  ?Neuro/Psych ? Headaches, SAH ?negative psych ROS  ? GI/Hepatic ?negative GI ROS, Neg liver ROS,   ?Endo/Other  ?negative endocrine ROS ? Renal/GU ?negative Renal ROS  ? ?  ?Musculoskeletal ?negative musculoskeletal ROS ?(+)  ? Abdominal ?  ?Peds ? Hematology ?negative hematology ROS ?(+) Lab Results ?     Component                Value               Date                 ?     WBC                      15.7 (H)            11/08/2021           ?     HGB                      13.6                11/08/2021           ?     HCT                      41.5                11/08/2021           ?     MCV                      93.9                11/08/2021           ?     PLT                      245                 11/08/2021           ?   ?Anesthesia Other Findings ? ? Reproductive/Obstetrics ?Lab Results ?     Component                Value               Date                 ?     HCG                      <5.0                11/08/2021           ? ? ?  ? ? ? ? ? ? ? ? ? ? ? ? ? ?  ?  ? ? ? ? ? ? ? ? ?  Anesthesia Physical ?Anesthesia Plan ? ?ASA: 3 and emergent ? ?Anesthesia Plan: General  ? ?Post-op Pain Management: Minimal or no pain anticipated  ? ?Induction: Intravenous, Rapid sequence and Cricoid pressure planned ? ?PONV Risk Score and Plan: 3 and Ondansetron and Dexamethasone ? ?Airway Management Planned: Oral  ETT ? ?Additional Equipment: Arterial line ? ?Intra-op Plan:  ? ?Post-operative Plan: Extubation in OR and Possible Post-op intubation/ventilation ? ?Informed Consent: I have reviewed the patients History and Physical, chart, labs and discussed the procedure including the risks, benefits and alternatives for the proposed anesthesia with the patient or authorized representative who has indicated his/her understanding and acceptance.  ? ? ? ?Dental advisory given ? ?Plan Discussed with: CRNA ? ?Anesthesia Plan Comments:   ? ? ? ? ? ? ?Anesthesia Quick Evaluation ? ?

## 2021-11-08 NOTE — Procedures (Signed)
INTERVENTIONAL NEURORADIOLOGY BRIEF POSTPROCEDURE NOTE ? ?DIAGNOSTIC CEREBRAL ANGIOGRAM AND ENDOVASCULAR ANEURYSM EMBOLIZATION  ?  ?Attending: Dr. Baldemar Lenis ?  ?Diagnosis: Aneurysmal subarachnoid hemorrhage  ?  ?Access site: Distal right radial artery  ?  ?Access closure: Inflatable band  ?  ?Anesthesia: General endotracheal anesthesia  ?  ?Medication used: Refer to anesthesia documentation. ?  ?Complications: None  ?  ?Estimated blood loss: None  ?  ?Specimen: None  ?  ?Findings:  ?  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  Coil embolization performed of each aneurysm.  ?  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  However, given pattern of bleed and is smooth configuration, this was thought to not be the cause of bleeding. ?  ?The left internal carotid artery ends at the communicating segment with the left ACA and MCA supplied by the right ICA via a prominent anterior communicating artery.  A small fenestration is also noted in the anterior communicating artery.  ?  ?No aneurysm of the left anterior circulation or posterior circulation identified.  ?  ?The patient tolerated the procedure well without incident or complication and is in stable condition. ? ?  ?

## 2021-11-08 NOTE — Transfer of Care (Signed)
Immediate Anesthesia Transfer of Care Note ? ?Patient: Debbie Armstrong ? ?Procedure(s) Performed: INTERVENTIONAL RADIOLOGY WITH ANESTHESIA ? ?Patient Location: PACU ? ?Anesthesia Type:General ? ?Level of Consciousness: awake, alert  and drowsy ? ?Airway & Oxygen Therapy: Patient Spontanous Breathing and Patient connected to face mask oxygen ? ?Post-op Assessment: Report given to RN and Post -op Vital signs reviewed and stable ? ?Post vital signs: Reviewed and stable ? ?Last Vitals:  ?Vitals Value Taken Time  ?BP 120/70 11/08/21 2138  ?Temp    ?Pulse 91 11/08/21 2135  ?Resp 16 11/08/21 2141  ?SpO2 100 % 11/08/21 2135  ?Vitals shown include unvalidated device data. ? ?Last Pain:  ?Vitals:  ? 11/08/21 1226  ?TempSrc: Oral  ?   ? ?  ? ?Complications: No notable events documented. ?

## 2021-11-08 NOTE — Progress Notes (Incomplete)
Pacu RN Report to floor given ? ?Gave report to  [employer]. Room: 4N18. Discussed surgery, meds given in OR and Pacu, VS, IV fluids given, EBL, urine output, pain and other pertinent information. Also discussed if pt had any family or friends here or belongings with them.  ? ?Pt has a TR band to R radial wrist, 6cc initially, removed 2, 4cc air remaining. No bleeding noted. +CSM.  ?Foley cath in place, 200 out. L radial aline is 30 points higher than cuff.  ?T max 99.7. Neuro intact, good strength in all extremities.  ? ? ? ? ?Pt exits my care.   ?

## 2021-11-08 NOTE — ED Triage Notes (Signed)
Pt arrived POV to triage complaining of sudden onset headache that started about 1hr ago. Pt having light and sound sensitivity. Pt also complaining of N/V. ? ?Pt denies trauma to head. Denies hx of headaches ? ?Pain is 10/10 on both sides of head and down back of neck.  ? ? ?

## 2021-11-08 NOTE — ED Triage Notes (Signed)
Pt arrived via [facility] from home. Per EMS, pt called for migraine. Pt reports she used a vape which is normal for her and began having pain in the left side of her head which has been constant for approx 30 min. Pt had one episode of vomiting with EMS and still c/o nausea. Pt denies hx of migraine. Pt also reports sensitivity to light and vision being slightly blurry. Pt caox4. ?

## 2021-11-08 NOTE — H&P (Signed)
Debbie Armstrong is an 36 y.o. female.   ?Chief Complaint: headache ?HPI: when at approximately 1100 today complained of a sudden and severe headache after using the bathroom. She had been vaping prior also. The headache caused her to call ems and she was taken to [facility]. She left before being seen, her friend then brought her to the [facility] ED. Head CT showed an SAH. By report she was light and sound sensitive. 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  ?Her exam has remained very good and stable. ?No past medical history on file. ? ? ? ? ?Results for orders placed or performed during the hospital encounter of 11/08/21 (from the past 48 hour(s))  ?CBC     Status: Abnormal  ? Collection Time: 11/08/21  1:34 PM  ?Result Value Ref Range  ? WBC 15.7 (H) 4.0 - 10.5 K/uL  ? RBC 4.42 3.87 - 5.11 MIL/uL  ? Hemoglobin 13.6 12.0 - 15.0 g/dL  ? HCT 41.5 36.0 - 46.0 %  ? MCV 93.9 80.0 - 100.0 fL  ? MCH 30.8 26.0 - 34.0 pg  ? MCHC 32.8 30.0 - 36.0 g/dL  ? RDW 12.1 11.5 - 15.5 %  ? Platelets 245 150 - 400 K/uL  ? nRBC 0.0 0.0 - 0.2 %  ?  Comment: Performed at [facility], [address], [place], [place] [address]  ?Basic metabolic panel     Status: Abnormal  ? Collection Time: 11/08/21  1:34 PM  ?Result Value Ref Range  ? Sodium 138 135 - 145 mmol/L  ? Potassium 3.2 (L) 3.5 - 5.1 mmol/L  ? Chloride 110 98 - 111 mmol/L  ? CO2 20 (L) 22 - 32 mmol/L  ? Glucose, Bld 115 (H) 70 - 99 mg/dL  ?  Comment: Glucose reference range applies only to samples taken after fasting for at least 8 hours.  ? BUN 11 6 - 20 mg/dL  ? Creatinine, Ser 0.68 0.44 - 1.00 mg/dL  ? Calcium 8.5 (L) 8.9 - 10.3 mg/dL  ? GFR, Estimated >60 >60 mL/min  ?  Comment: (NOTE) ?Calculated using the CKD-EPI Creatinine Equation (2021) ?  ? Anion gap 8 5 - 15  ?  Comment: Performed at [facility], [address], [place], [place] [address]  ?I-Stat Beta hCG blood, ED ([facility], [facility], [facility] only)     Status: None  ? Collection Time: 11/08/21  1:44 PM  ?Result Value Ref  Range  ? I-stat hCG, quantitative <5.0 <5 mIU/mL  ? Comment 3          ?  Comment:   GEST. AGE      CONC.  (mIU/mL) ?  <=1 WEEK        5 - 50 ?    2 WEEKS       50 - 500 ?    3 WEEKS       100 - 10,000 ?    4 WEEKS     1,000 - 30,000 ?       ?FEMALE AND NON-PREGNANT FEMALE: ?    LESS THAN 5 mIU/mL ?  ? ?CT Angio Head W or Wo Contrast ? ?Result Date: 11/08/2021 ?CLINICAL DATA:  Acute subarachnoid hemorrhage, aneurysmal pattern. EXAM: CT ANGIOGRAPHY HEAD TECHNIQUE: Multidetector CT imaging of the head was performed using the standard protocol during bolus administration of intravenous contrast. Multiplanar CT image reconstructions and MIPs were obtained to evaluate the vascular anatomy. RADIATION DOSE REDUCTION: This exam was  performed according to the departmental dose-optimization program which includes automated exposure control, adjustment of the mA and/or kV according to patient size and/or use of iterative reconstruction technique. CONTRAST:  OMNIPAQUE IOHEXOL 350 MG/ML SOLN COMPARISON:  Head CT earlier same day. FINDINGS: CTA HEAD Anterior circulation: The right cervical internal carotid artery is widely patent through the skull base and siphon regions. 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 It appears that the left cervical ICA supplies the left posterior cerebral artery and may give a tiny contribution to the left anterior and middle cerebral vessels, but that the majority of the flow to the left anterior circulation comes from the right carotid circulation through the anterior communicating artery complex. There appears to be an irregular aneurysm at the anterior communicating artery, difficult to measure precisely, but probably on the order of 4 mm in size. Posterior circulation: Both vertebral arteries are patent through the foramen magnum to the basilar. The left is dominant. The  basilar artery is a small vessel that supplies the superior cerebellar arteries and give some contribution to the right PCA. Minimal if any contribution to the left PCA. Venous sinuses: Patent and normal. Anatomic variants: None other significant. Review of the MIP images confirms the above findings. IMPRESSION: Unusual baseline vascular anatomy. A small left internal carotid artery supplies the left posterior cerebral artery and gives a tiny contribution to the left anterior and middle cerebral vessels. 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 Catheter angiography may be necessary to understand the exact details of this vascular anatomy. Electronically Signed   By: Paulina Fusi M.D.   On: 11/08/2021 15:37  ? ?CT HEAD WO CONTRAST ( ) ? ?Result Date: 11/08/2021 ?CLINICAL DATA:  Headache, severe headache and vomiting. EXAM: CT HEAD WITHOUT CONTRAST TECHNIQUE: Contiguous axial images were obtained from the base of the skull through the vertex without intravenous contrast. RADIATION DOSE REDUCTION: This exam was performed according to the departmental dose-optimization program which includes automated exposure control, adjustment of the mA and/or kV according to patient size and/or use of iterative reconstruction technique. COMPARISON:  None. FINDINGS: Brain: Acute subarachnoid hemorrhage extending from the suprasellar cistern to the bilateral sylvian fissures and along the anterior falx, most dense at the suprasellar cistern suggesting aneurysm rupture. Small amount of additional acute hemorrhage within the third and fourth ventricle. No parenchymal hemorrhage or parenchymal edema is identified. 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 Skull: Normal. Negative for fracture or focal lesion. Sinuses/Orbits: No acute finding. Other: None.  IMPRESSION: 1. 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 Small amount of additional acute hemorrhage within the third and fourth ventricle. Recommend immediate neurosurgery consultation and CT angiogram for more definitive characterization of the intracranial vasculature. 2. No parenchymal hemorrhage or parenchymal edema is identified. Critical Value/emergent results were called by telephone at the time of interpretation on 11/08/2021 at 1:28 pm to provider [facility] , who verbally acknowledged these results. Electronically Signed   By: Bary Richard M.D.   On: 11/08/2021 13:33   ? ?Review of Systems  ?Constitutional: Negative.   ?HENT: Negative.    ?Eyes:  Negative.   ?Respiratory: Negative.    ?Cardiovascular: Negative.   ?Gastrointestinal:  Positive for vomiting.  ?Endocrine: Negative.   ?Genitourinary: Negative.   ?Musculoskeletal:  Positive for neck pain and neck stiffness.  ?Skin: Negative.   ?Neurological:  Positive for headaches.  ?     Headache  ?Psychiatric/Behavioral: Negative.    ? ?Blood pressure (!) 138/96, pulse 71, temperature 97.6 ?F (36.4 ?C), temperature source Oral, resp. rate 12, height  (1.676 m), weight 93 kg, SpO2 100 %. ?Physical Exam ?Constitutional:   ?   Appearance: She is ill-appearing.  ?HENT:  ?   Head: Normocephalic and atraumatic.  ?   Right Ear: External ear normal.  ?   Left Ear: External ear normal.  ?   Nose: Nose normal.  ?   Mouth/Throat:  ?   Mouth: Mucous membranes are moist.  ?   Pharynx: Oropharynx is clear.  ?Eyes:  ?   Extraocular Movements: Extraocular movements intact.  ?   Conjunctiva/sclera: Conjunctivae normal.  ?   Pupils: Pupils are equal, round, and reactive to light.  ?Cardiovascular:  ?   Rate and Rhythm: Normal rate and regular rhythm.  ?Pulmonary:  ?   Effort: Pulmonary effort is normal.  ?Abdominal:  ?    General: Abdomen is flat.  ?   Palpations: Abdomen is soft.  ?Musculoskeletal:     ?   General: Normal range of motion.  ?   Cervical back: Rigidity present.  ?Skin: ?   General: Skin is warm and dry.  ?N

## 2021-11-09 ENCOUNTER — Inpatient Hospital Stay (HOSPITAL_COMMUNITY): Payer: BC Managed Care – PPO

## 2021-11-09 ENCOUNTER — Encounter (HOSPITAL_COMMUNITY): Payer: Self-pay | Admitting: Radiology

## 2021-11-09 DIAGNOSIS — I671 Cerebral aneurysm, nonruptured: Secondary | ICD-10-CM

## 2021-11-09 LAB — ABO/RH: ABO/RH(D): O POS

## 2021-11-09 MED ORDER — OXYCODONE HCL 5 MG PO TABS
5.0000 mg | ORAL_TABLET | ORAL | Status: DC | PRN
  Administered 2021-11-09 – 2021-11-10 (×4): 10 mg via ORAL
  Administered 2021-11-11 (×3): 5 mg via ORAL
  Administered 2021-11-11 – 2021-11-13 (×10): 10 mg via ORAL
  Administered 2021-11-14: 5 mg via ORAL
  Administered 2021-11-16 (×2): 10 mg via ORAL
  Filled 2021-11-09 (×3): qty 2
  Filled 2021-11-09: qty 1
  Filled 2021-11-09: qty 2
  Filled 2021-11-09: qty 1
  Filled 2021-11-09: qty 2
  Filled 2021-11-09: qty 1
  Filled 2021-11-09 (×2): qty 2
  Filled 2021-11-09: qty 1
  Filled 2021-11-09 (×9): qty 2

## 2021-11-09 MED ORDER — PROCHLORPERAZINE 25 MG RE SUPP
25.0000 mg | Freq: Two times a day (BID) | RECTAL | Status: DC | PRN
  Filled 2021-11-09: qty 1

## 2021-11-09 MED ORDER — DEXAMETHASONE 4 MG PO TABS
4.0000 mg | ORAL_TABLET | Freq: Four times a day (QID) | ORAL | Status: AC
  Administered 2021-11-09 – 2021-11-10 (×4): 4 mg via ORAL
  Filled 2021-11-09 (×4): qty 1

## 2021-11-09 NOTE — Progress Notes (Signed)
? ? ?Supervising Physician: Baldemar Lenise Macedo Rodrigues, [place] ? ?Patient [facility]:  [facility] ? ?Chief Complaint: ? ?Aneurysmal subarachnoid hemorrhage 1 day s/p coil embolization of 3 aneurysms. ? ?Subjective: ? ?Headache overnight, but now resolved. No complaints ? ? ?Allergies: ?Patient has no known allergies. ? ?Medications: ?Prior to Admission medications   ?Medication Sig Start Date End Date Taking? Authorizing Provider  ?Multiple Vitamins-Minerals (HAIR SKIN & NAILS ADVANCED) TABS Take 1 tablet by mouth daily.   Yes [provider]  ? ? ? ?Vital Signs: ?BP 102/77   Pulse (!) 43   Temp 97.9 ?F (36.6 ?C) (Oral)   Resp 13   Ht 5\' 6"  (1.676 m)   Wt 205 lb (93 kg)   SpO2 95%   BMI 33.09 kg/m?  ? ?Physical Exam ?Constitutional:   ?   General: She is not in acute distress. ?HENT:  ?   Head: Normocephalic and atraumatic.  ?   Mouth/Throat:  ?   Mouth: Mucous membranes are moist.  ?   Pharynx: Oropharynx is clear.  ?Eyes:  ?   Extraocular Movements: Extraocular movements intact.  ?   Conjunctiva/sclera: Conjunctivae normal.  ?Cardiovascular:  ?   Rate and Rhythm: Normal rate and regular rhythm.  ?Pulmonary:  ?   Effort: Pulmonary effort is normal. No respiratory distress.  ?Neurological:  ?   General: No focal deficit present.  ?   Mental Status: She is alert and oriented to person, place, and time. Mental status is at baseline.  ?   Cranial Nerves: No cranial nerve deficit.  ?   Sensory: No sensory deficit.  ?Psychiatric:     ?   Mood and Affect: Mood normal.  ? ? ?Imaging: ?CT Angio Head W or Wo Contrast ? ?Result Date: 11/08/2021 ?CLINICAL DATA:  Acute subarachnoid hemorrhage, aneurysmal pattern. EXAM: CT ANGIOGRAPHY HEAD TECHNIQUE: Multidetector CT imaging of the head was performed using the standard protocol during bolus administration of intravenous contrast. Multiplanar CT image reconstructions and MIPs were obtained to evaluate the vascular anatomy. RADIATION DOSE REDUCTION: This exam was performed  according to the departmental dose-optimization program which includes automated exposure control, adjustment of the mA and/or kV according to patient size and/or use of iterative reconstruction technique. CONTRAST:  100mL OMNIPAQUE IOHEXOL 350 MG/ML SOLN COMPARISON:  Head CT earlier same day. FINDINGS: CTA HEAD Anterior circulation: The right cervical internal carotid artery is widely patent through the skull base and siphon regions. 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 It appears that the left cervical ICA supplies the left posterior cerebral artery and may give a tiny contribution to the left anterior and middle cerebral vessels, but that the majority of the flow to the left anterior circulation comes from the right carotid circulation through the anterior communicating artery complex. There appears to be an irregular aneurysm at the anterior communicating artery, difficult to measure precisely, but probably on the order of 4 mm in size. Posterior circulation: Both vertebral arteries are patent through the foramen magnum to the basilar. The left is dominant. The basilar artery is a small vessel that supplies the superior cerebellar arteries and give some contribution to the right PCA. Minimal if any contribution to the left PCA. Venous sinuses: Patent and normal. Anatomic variants: None other significant. Review of the MIP images confirms the above findings. IMPRESSION: Unusual baseline vascular anatomy. A small  left internal carotid artery supplies the left posterior cerebral artery and gives a tiny contribution to the left anterior and middle cerebral vessels. 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 Catheter angiography may be necessary  to understand the exact details of this vascular anatomy. Electronically Signed   By: Paulina Fusi M.D.   On: 11/08/2021 15:37  ? ?CT HEAD WO CONTRAST ( ) ? ?Result Date: 11/09/2021 ?CLINICAL DATA:  36 year old female presenting with aneurysmal rupture pattern of subarachnoid hemorrhage yesterday. 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 3 mm paraophthalmic aneurysm. EXAM: CT HEAD WITHOUT CONTRAST TECHNIQUE: Contiguous axial images were obtained from the base of the skull through the vertex without intravenous contrast. RADIATION DOSE REDUCTION: This exam was performed according to the departmental dose-optimization program which includes automated exposure control, adjustment of the mA and/or kV according to patient size and/or use of iterative reconstruction technique. COMPARISON:  Presentation head CT 11/08/2021. FINDINGS: Brain: Aneurysmal pattern subarachnoid hemorrhage stable since yesterday, relatively sparing the cisterna magna and convexities. Small volume of IVH layering now in the lateral ventricles. No ventriculomegaly. No midline shift. No cortically based acute infarct identified. Stable gray-white matter differentiation throughout the brain. Unusual configuration of the anterior 3rd ventricle and suprasellar cistern, as well as prominent scattered dural calcifications. Some of the suprasellar cistern anatomy might be obscured by isodense blood also. Vascular: Multiple small anterior communicating artery region aneurysm coil packs now. No suspicious intracranial vascular hyperdensity. Skull: No acute osseous abnormality identified. Sinuses/Orbits: Visualized paranasal sinuses and mastoids are stable and well aerated. Other: Negative orbit and scalp soft tissues. IMPRESSION: 1. Stable Aneurysmal pattern SAH since presentation. Trace IVH with no ventriculomegaly or significant intracranial mass effect. 2. Several small  new anterior communicating artery region endovascular coil packs. 3. No new intracranial abnormality. Electronically Signed   By: Odessa Fleming M.D.   On: 11/09/2021 05:20  ? ?CT HEAD WO CONTRAST ( ) ? ?Result Date: 11/08/2021 ?CLINICAL DATA:  Headache, severe headache and vomiting. EXAM: CT HEAD WITHOUT CONTRAST TECHNIQUE: Contiguous axial images were obtained from the base of the skull through the vertex without intravenous contrast. RADIATION DOSE REDUCTION: This exam was performed according to the departmental dose-optimization program which includes automated exposure control, adjustment of the mA and/or kV according to patient size and/or use of iterative reconstruction technique. COMPARISON:  None. FINDINGS: Brain: Acute subarachnoid hemorrhage extending from the suprasellar cistern to the bilateral sylvian fissures and along the anterior falx, most dense at the suprasellar cistern suggesting aneurysm rupture. Small amount of additional acute hemorrhage within the third and fourth ventricle. No parenchymal hemorrhage or parenchymal edema is identified. 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 Skull: Normal. Negative for fracture or focal lesion. Sinuses/Orbits: No acute finding. Other: None. IMPRESSION: 1. 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 Small amount of additional acute hemorrhage within the third and fourth ventricle. Recommend immediate neurosurgery consultation and CT angiogram for more definitive characterization of the intracranial vasculature. 2. No parenchymal hemorrhage or parenchymal edema is identified. Critical Value/emergent results were called by telephone at the time of interpretation on 11/08/2021 at 1:28 pm to provider [facility] , who verbally acknowledged these results. Electronically Signed  By: Bary Richard M.D.   On: 11/08/2021 13:33   ? ?Labs: ? ?CBC: ?Recent Labs  ?  11/08/21 ?1334 11/08/21 ?1720  ?WBC 15.7* 18.1*  ?HGB 13.6 13.8  ?HCT 41.5 42.9  ?PLT 245 264  ? ? ?COAGS: ?Recent Labs  ?  11/08/21 ?1626

## 2021-11-09 NOTE — Progress Notes (Signed)
Transcranial Doppler ? ?Date POD PCO2 HCT BP  MCA ACA PCA OPHT SIPH VERT Basilar  ?4/10 ?RH     Right  ?Left  ? *  ?*  ? *  ?*  ? *  ?*  ? 26  ?22  ? 27  ?26  ? -34  ?-21  ? -44  ?  ?  ?     Right  ?Left  ?   ?  ?   ?  ?   ?  ?   ?  ?   ?  ?   ?  ?   ?  ?  ?     Right  ?Left  ?   ?  ?   ?  ?   ?  ?   ?  ?   ?  ?   ?  ?   ?  ?  ? ?     Right  ?Left  ?   ?  ?   ?  ?   ?  ?   ?  ?   ?  ?   ?  ?   ?  ?  ? ?     Right  ?Left  ?   ?  ?   ?  ?   ?  ?   ?  ?   ?  ?   ?  ?   ?  ?  ?     Right  ?Left  ?   ?  ?   ?  ?   ?  ?   ?  ?   ?  ?   ?  ?   ?  ?  ?     Right  ?Left  ?   ?  ?   ?  ?   ?  ?   ?  ?   ?  ?   ?  ?   ?  ?  ? ?MCA = Middle Cerebral Artery      OPHT = Opthalmic Artery     BASILAR = Basilar Artery   ?ACA = Anterior Cerebral Artery     SIPH = Carotid Siphon ?PCA = Posterior Cerebral Artery   VERT = Verterbral Artery                  ? ?Normal ?MCA = 62+\-12 ?ACA = 50+\-12 ?PCA = 42+\-23  ? ?*= Unable to insonate ? ?Jean Rosenthal, RDMS, RVT ? ? ? ?

## 2021-11-09 NOTE — Progress Notes (Signed)
Patient ID: Debbie Armstrong, female   DOB: Dec 02, 1985, 36 y.o.   MRN: [id number] ?BP 123/70   Pulse 77   Temp 98.8 ?F (37.1 ?C) (Oral)   Resp 10   Ht 5\' 6"  (1.676 m)   Wt 93 kg   SpO2 99%   BMI 33.09 kg/m?  ?Alert and oriented x 4 ?Speech is clear and lfuent ?Moving all extremities well ?Comfortable at this time ?Continue hydration. ?

## 2021-11-09 NOTE — Progress Notes (Signed)
At 0400, pt vomited x2 with a a minor headache 3/10. ? ?At 0430, pt vomiting again and saying this headache is "the worst its ever been 10/10" ? ?Stat CT head obtained, notified MD Cabbell. Orders for compazine and oxy.Will continue to monitor. ?

## 2021-11-09 NOTE — Progress Notes (Signed)
Patient ID: Debbie Armstrong, female   DOB: 1986/07/20, 36 y.o.   MRN: [id number] ?BP 122/60   Pulse (!) 43   Temp 99 ?F (37.2 ?C) (Oral)   Resp 17   Ht 5\' 6"  (1.676 m)   Wt 93 kg   SpO2 95%   BMI 33.09 kg/m?  ?Head CT reviewed, no evidence of rebleeding. Headache may be due to inflammation of just coiled aneurysms, will try decadron.  ?

## 2021-11-09 NOTE — Progress Notes (Signed)
?  Transition of Care (TOC) Screening Note ? ? ?Patient Details  ?Name: Debbie Armstrong ?Date of Birth: 1985-12-22 ? ? ?Transition of Care (TOC) CM/SW Contact:    ?Mearl Latin, LCSW ?Phone Number: ?11/09/2021, 1:25 PM ? ? ? ?Transition of Care Department [facility]) has reviewed patient and no TOC needs have been identified at this time. We will continue to monitor patient advancement through interdisciplinary progression rounds. If new patient transition needs arise, please place a TOC consult. ? ? ?

## 2021-11-10 ENCOUNTER — Other Ambulatory Visit: Payer: Self-pay | Admitting: Physician Assistant

## 2021-11-10 DIAGNOSIS — I609 Nontraumatic subarachnoid hemorrhage, unspecified: Secondary | ICD-10-CM

## 2021-11-10 NOTE — Progress Notes (Signed)
? ? ?Referring Physician(s): ?Coletta MemosKyle Cabbell ? ?Supervising Physician: Baldemar Lenise Macedo Rodrigues, [place] ? ?[facility]  [facility] ? ?Chief Complaint: ? ?Headache/ SAH ? ?Brief History: ? ?Debbie Armstrong is a 36 y.o. female with no significant past medical history who presented to the ED yesterday after sudden onset headache that started around 11 am yesterday. ? ?CT showed SAH in the basal cisterns with 4th ventricle IVH.  ? 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 ? ?She underwent coiling of 3 ACA aneurysms yesterday by Dr. Tommie Samsde Macedo Rodrigues. ? 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  A small fenestration is also noted in the anterior communicating artery. ? ?Subjective: ? ?Doing well this morning. Still a little headache at the back of her head. She did take medication for it and it has eased off. ? ?Allergies: ?Patient has no known allergies. ? ?Medications: ?Prior to Admission medications   ?Medication Sig Start Date End Date Taking? Authorizing Provider  ?Multiple Vitamins-Minerals (HAIR SKIN & NAILS ADVANCED) TABS Take 1 tablet by mouth daily.   Yes [provider]  ? ? ? ?Vital Signs: ?BP 125/72   Pulse 66   Temp 98.2 ?F (36.8 ?C) (Oral)   Resp 16   Ht 5\' 6"  (1.676 m)   Wt 205 lb (93 kg)   SpO2 99%   BMI 33.09 kg/m?  ? ?Physical Exam ?Vitals reviewed.  ?Constitutional:   ?   Appearance: Normal appearance.  ?Cardiovascular:  ?   Rate and Rhythm: Normal rate and regular rhythm.  ?Pulmonary:  ?   Effort: Pulmonary effort is normal. No respiratory distress.  ?Musculoskeletal:     ?   General: Normal range of motion.  ?   Comments: Radial stick site ok. No hematoma  ?Skin: ?   General: Skin is warm and dry.  ?Neurological:  ?   General: No focal deficit present.  ?   Mental Status: She is alert and oriented to person, place, and time.   ?Psychiatric:     ?   Mood and Affect: Mood normal.     ?   Behavior: Behavior normal.     ?   Thought Content: Thought content normal.     ?   Judgment: Judgment normal.  ? ? ?Imaging: ?CT Angio Head W or Wo Contrast ? ?Result Date: 11/08/2021 ?CLINICAL DATA:  Acute subarachnoid hemorrhage, aneurysmal pattern. EXAM: CT ANGIOGRAPHY HEAD TECHNIQUE: Multidetector CT imaging of the head was performed using the standard protocol during bolus administration of intravenous contrast. Multiplanar CT image reconstructions and MIPs were obtained to evaluate the vascular anatomy. RADIATION DOSE REDUCTION: This exam was performed according to the departmental dose-optimization program which includes automated exposure control, adjustment of the mA and/or kV according to patient size and/or use of iterative reconstruction technique. CONTRAST:  100mL OMNIPAQUE IOHEXOL 350 MG/ML SOLN COMPARISON:  Head CT earlier same day. FINDINGS: CTA HEAD Anterior circulation: The right cervical internal carotid artery is widely patent through the skull base and siphon regions. 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 It appears that the left cervical ICA supplies the left posterior cerebral artery and may give a tiny contribution to the left anterior and middle cerebral vessels, but that the majority  of the flow to the left anterior circulation comes from the right carotid circulation through the anterior communicating artery complex. There appears to be an irregular aneurysm at the anterior communicating artery, difficult to measure precisely, but probably on the order of 4 mm in size. Posterior circulation: Both vertebral arteries are patent through the foramen magnum to the basilar. The left is dominant. The basilar artery is a small vessel that supplies the superior cerebellar arteries and give some contribution to the  right PCA. Minimal if any contribution to the left PCA. Venous sinuses: Patent and normal. Anatomic variants: None other significant. Review of the MIP images confirms the above findings. IMPRESSION: Unusual baseline vascular anatomy. A small left internal carotid artery supplies the left posterior cerebral artery and gives a tiny contribution to the left anterior and middle cerebral vessels. 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 Catheter angiography may be necessary to understand the exact details of this vascular anatomy. Electronically Signed   By: Paulina Fusi M.D.   On: 11/08/2021 15:37  ? ?CT HEAD WO CONTRAST ( ) ? ?Result Date: 11/09/2021 ?CLINICAL DATA:  36 year old female presenting with aneurysmal rupture pattern of subarachnoid hemorrhage yesterday. 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 3 mm paraophthalmic aneurysm. EXAM: CT HEAD WITHOUT CONTRAST TECHNIQUE: Contiguous axial images were obtained from the base of the skull through the vertex without intravenous contrast. RADIATION DOSE REDUCTION: This exam was performed according to the departmental dose-optimization program which includes automated exposure control, adjustment of the mA and/or kV according to patient size and/or use of iterative reconstruction technique. COMPARISON:  Presentation head CT 11/08/2021. FINDINGS: Brain: Aneurysmal pattern subarachnoid hemorrhage stable since yesterday, relatively sparing the cisterna magna and convexities. Small volume of IVH layering now in the lateral ventricles. No ventriculomegaly. No midline shift. No cortically based acute infarct identified. Stable gray-white matter differentiation throughout the brain. Unusual configuration of the anterior 3rd ventricle and  suprasellar cistern, as well as prominent scattered dural calcifications. Some of the suprasellar cistern anatomy might be obscured by isodense blood also. Vascular: Multiple small anterior communicating artery region aneurysm coil packs now. No suspicious intracranial vascular hyperdensity. Skull: No acute osseous abnormality identified. Sinuses/Orbits: Visualized paranasal sinuses and mastoids are stable and well aerated. Other: Negative orbit and scalp soft tissues. IMPRESSION: 1. Stable Aneurysmal pattern SAH since presentation. Trace IVH with no ventriculomegaly or significant intracranial mass effect. 2. Several small new anterior communicating artery region endovascular coil packs. 3. No new intracranial abnormality. Electronically Signed   By: Odessa Fleming M.D.   On: 11/09/2021 05:20  ? ?CT HEAD WO CONTRAST ( ) ? ?Result Date: 11/08/2021 ?CLINICAL DATA:  Headache, severe headache and vomiting. EXAM: CT HEAD WITHOUT CONTRAST TECHNIQUE: Contiguous axial images were obtained from the base of the skull through the vertex without intravenous contrast. RADIATION DOSE REDUCTION: This exam was performed according to the departmental dose-optimization program which includes automated exposure control, adjustment of the mA and/or kV according to patient size and/or use of iterative reconstruction technique. COMPARISON:  None. FINDINGS: Brain: Acute subarachnoid hemorrhage extending from the suprasellar cistern to the bilateral sylvian fissures and along the anterior falx, most dense at the suprasellar cistern suggesting aneurysm rupture. Small amount of additional acute hemorrhage within the third and fourth ventricle. No parenchymal hemorrhage or parenchymal edema is identified. Vascular:  Presumed ruptured aneurysm at the suprasellar cistern, as above. Skull: Normal. Negative for fracture or focal lesion. Sinuses/Orbits: No acute finding. Other: None. IMPRESSION: 1. Evidence of aneurysm rupture at the suprasellar cistern  with acute subarachnoid hemorrhage extending from the suprasellar cistern to the bilateral Sylvian fissures and along the anterior falx. Small amount of additional acute hemorrhage within the third and fourth ventric

## 2021-11-10 NOTE — Progress Notes (Signed)
Patient ID: Debbie Armstrong, female   DOB: [birth year], 36 y.o.   MRN: [id number] ?BP (!) 127/100   Pulse 62   Temp 98 ?F (36.7 ?C) (Oral)   Resp 20   Ht 5\' 6"  (1.676 m)   Wt 93 kg   SpO2 99%   BMI 33.09 kg/m?  ?Alert and oriented x 4 ?Speech is clear and fluent, perrl ?Moving all extremities well ?No headache at this time ?Doing well ?

## 2021-11-10 NOTE — TOC CAGE-AID Note (Signed)
Transition of Care (TOC) - CAGE-AID Screening ? ? ?Patient Details  ?Name: Debbie Armstrong ?MRN: [id number] ?Date of Birth: 08/17/1985 ? ?Transition of Care (TOC) CM/SW Contact:    ?Aubriel Khanna C Tarpley-Carter, LCSWA ?Phone Number: ?11/10/2021, 2:43 PM ? ? ?Clinical Narrative: ?Pt participated in Cage-Aid.  Pt stated she does use marijuana.  Pt was offered resources, due to usage of substance.    ?   ?Insurance underwriter, MSW, LCSW-A ?Pronouns:  She/Her/Hers ?[facility] ?Clinical Social Worker ?Direct Number:  [phone] ?[email or web address] ? ?CAGE-AID Screening: ?  ? ?Have You Ever Felt You Ought to Cut Down on Your Drinking or Drug Use?: No ?Have People Annoyed You By Critizing Your Drinking Or Drug Use?: No ?Have You Felt Bad Or Guilty About Your Drinking Or Drug Use?: No ?Have You Ever Had a Drink or Used Drugs First Thing In The Morning to Steady Your Nerves or to Get Rid of a Hangover?: No ?CAGE-AID Score: 0 ? ?Substance Abuse Education Offered: Yes ? ?Substance abuse interventions: Educational Materials ? ? ? ? ? ? ?

## 2021-11-11 ENCOUNTER — Inpatient Hospital Stay (HOSPITAL_COMMUNITY): Payer: BC Managed Care – PPO

## 2021-11-11 DIAGNOSIS — I671 Cerebral aneurysm, nonruptured: Secondary | ICD-10-CM

## 2021-11-11 NOTE — Progress Notes (Signed)
? ? ?Referring Physician(s): ?Dr. Coletta Memos ? ?Supervising Physician: Baldemar Lenis ? ?Patient Status:  [facility] - In-pt ? ?Chief Complaint: ?Subarachnoid hemorrhage ? ?Subjective: ?Sitting up in chair.  ?Reports headache, heaviness, and nausea today as expected 4 days post hemorrhage.  ?No weakness, focal deficits.  ?Working to improve appetite.  ? ?Allergies: ?Patient has no known allergies. ? ?Medications: ?Prior to Admission medications   ?Medication Sig Start Date End Date Taking? Authorizing Provider  ?Multiple Vitamins-Minerals (HAIR SKIN & NAILS ADVANCED) TABS Take 1 tablet by mouth daily.   Yes [provider]  ? ? ? ?Vital Signs: ?BP 114/70   Pulse (!) 58   Temp 98.4 ?F (36.9 ?C) (Oral)   Resp 15   Ht 5\' 6"  (1.676 m)   Wt 205 lb (93 kg)   SpO2 99%   BMI 33.09 kg/m?  ? ?Physical Exam ?NAD, alert ?Neuro: alert, oriented, conversant, speech clear, moving all extremities.  ? ?Imaging: ?CT Angio Head W or Wo Contrast ? ?Result Date: 11/08/2021 ?CLINICAL DATA:  Acute subarachnoid hemorrhage, aneurysmal pattern. EXAM: CT ANGIOGRAPHY HEAD TECHNIQUE: Multidetector CT imaging of the head was performed using the standard protocol during bolus administration of intravenous contrast. Multiplanar CT image reconstructions and MIPs were obtained to evaluate the vascular anatomy. RADIATION DOSE REDUCTION: This exam was performed according to the departmental dose-optimization program which includes automated exposure control, adjustment of the mA and/or kV according to patient size and/or use of iterative reconstruction technique. CONTRAST:  01/08/2022 OMNIPAQUE IOHEXOL 350 MG/ML SOLN COMPARISON:  Head CT earlier same day. FINDINGS: CTA HEAD Anterior circulation: The right cervical internal carotid artery is widely patent through the skull base and siphon regions. The left cervical internal carotid artery is a small vessel, probably congenital based on the fact that the carotid canal is smaller on  the left than the right. It appears that the left cervical ICA supplies the left posterior cerebral artery and may give a tiny contribution to the left anterior and middle cerebral vessels, but that the majority of the flow to the left anterior circulation comes from the right carotid circulation through the anterior communicating artery complex. There appears to be an irregular aneurysm at the anterior communicating artery, difficult to measure precisely, but probably on the order of 4 mm in size. Posterior circulation: Both vertebral arteries are patent through the foramen magnum to the basilar. The left is dominant. The basilar artery is a small vessel that supplies the superior cerebellar arteries and give some contribution to the right PCA. Minimal if any contribution to the left PCA. Venous sinuses: Patent and normal. Anatomic variants: None other significant. Review of the MIP images confirms the above findings. IMPRESSION: Unusual baseline vascular anatomy. A small left internal carotid artery supplies the left posterior cerebral artery and gives a tiny contribution to the left anterior and middle cerebral vessels. 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 Catheter angiography may be necessary to understand the exact details of this vascular anatomy. Electronically Signed   By: M.D.   On: 11/08/2021 15:37  ? ?CT HEAD WO CONTRAST (01/08/2022) ? ?Result Date: 11/09/2021 ?CLINICAL DATA:  36 year old female presenting with aneurysmal rupture pattern of subarachnoid hemorrhage yesterday. 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 3 mm paraophthalmic aneurysm. EXAM: CT HEAD WITHOUT  CONTRAST TECHNIQUE: Contiguous axial images were obtained from the base of the skull through the  vertex without intravenous contrast. RADIATION DOSE REDUCTION: This exam was performed according to the departmental dose-optimization program which includes automated exposure control, adjustment of the mA and/or kV according to patient size and/or use of iterative reconstruction technique. COMPARISON:  Presentation head CT 11/08/2021. FINDINGS: Brain: Aneurysmal pattern subarachnoid hemorrhage stable since yesterday, relatively sparing the cisterna magna and convexities. Small volume of IVH layering now in the lateral ventricles. No ventriculomegaly. No midline shift. No cortically based acute infarct identified. Stable gray-white matter differentiation throughout the brain. Unusual configuration of the anterior 3rd ventricle and suprasellar cistern, as well as prominent scattered dural calcifications. Some of the suprasellar cistern anatomy might be obscured by isodense blood also. Vascular: Multiple small anterior communicating artery region aneurysm coil packs now. No suspicious intracranial vascular hyperdensity. Skull: No acute osseous abnormality identified. Sinuses/Orbits: Visualized paranasal sinuses and mastoids are stable and well aerated. Other: Negative orbit and scalp soft tissues. IMPRESSION: 1. Stable Aneurysmal pattern SAH since presentation. Trace IVH with no ventriculomegaly or significant intracranial mass effect. 2. Several small new anterior communicating artery region endovascular coil packs. 3. No new intracranial abnormality. Electronically Signed   By: Odessa Fleming M.D.   On: 11/09/2021 05:20  ? ?CT HEAD WO CONTRAST ( ) ? ?Result Date: 11/08/2021 ?CLINICAL DATA:  Headache, severe headache and vomiting. EXAM: CT HEAD WITHOUT CONTRAST TECHNIQUE: Contiguous axial images were obtained from the base of the skull through the vertex without intravenous contrast. RADIATION DOSE REDUCTION: This exam was performed according to the departmental dose-optimization program which includes automated exposure  control, adjustment of the mA and/or kV according to patient size and/or use of iterative reconstruction technique. COMPARISON:  None. FINDINGS: Brain: Acute subarachnoid hemorrhage extending from the suprasellar cistern to the bilateral sylvian fissures and along the anterior falx, most dense at the suprasellar cistern suggesting aneurysm rupture. Small amount of additional acute hemorrhage within the third and fourth ventricle. No parenchymal hemorrhage or parenchymal edema is identified. 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 Skull: Normal. Negative for fracture or focal lesion. Sinuses/Orbits: No acute finding. Other: None. IMPRESSION: 1. 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 Small amount of additional acute hemorrhage within the third and fourth ventricle. Recommend immediate neurosurgery consultation and CT angiogram for more definitive characterization of the intracranial vasculature. 2. No parenchymal hemorrhage or parenchymal edema is identified. Critical Value/emergent results were called by telephone at the time of interpretation on 11/08/2021 at 1:28 pm to provider [facility] , who verbally acknowledged these results. Electronically Signed   By: Bary Richard M.D.   On: 11/08/2021 13:33  ? ?IR Transcath/Emboliz ? ?Result Date: 11/09/2021 ?INDICATION: 36 year old female presents the onset headache and neck pain on 11/08/2021 around 11 a.m. Head CT showed subarachnoid hemorrhage in the basal cisterns with small hematoma at the level of the anterior interhemispheric fissure. CT angiogram of the head was suggestive of an anterior communicating artery aneurysm. EXAM: ULTRASOUND-GUIDED VASCULAR ACCESS DIAGNOSTIC CEREBRAL ANGIOGRAM 3D ROTATIONAL ANGIOGRAM ENDOVASCULAR ANEURYSM EMBOLIZATION FLAT PANEL HEAD CT COMPARISON:  CT  angiogram of the head November 08, 2021. MEDICATIONS: No antibiotics utilized. ANESTHESIA/SEDATION: The procedure was performed under general anesthesia. CONTRAST:  136 mL of Omnipaque 300 mg/mL FLUOROSCOPY: Radiat

## 2021-11-11 NOTE — Progress Notes (Incomplete)
{  Select Note:[id number]} 

## 2021-11-11 NOTE — Progress Notes (Signed)
Transcranial Doppler ? ?Date POD PCO2 HCT BP  MCA ACA PCA OPHT SIPH VERT Basilar  ?4/10 ?RH     Right  ?Left  ? *  ?*  ? *  ?*  ? *  ?*  ? 26  ?22  ? 27  ?26  ? -34  ?-21  ? -44  ?  ?  ?4/12 ?RH     Right  ?Left  ? *  ?*  ? *  ?*  ? *  ?*  ? 20  ?21  ? 25  ?27  ? -40  ?-27  ? -60  ?  ?  ?     Right  ?Left  ?   ?  ?   ?  ?   ?  ?   ?  ?   ?  ?   ?  ?   ?  ?  ? ?     Right  ?Left  ?   ?  ?   ?  ?   ?  ?   ?  ?   ?  ?   ?  ?   ?  ?  ? ?     Right  ?Left  ?   ?  ?   ?  ?   ?  ?   ?  ?   ?  ?   ?  ?   ?  ?  ?     Right  ?Left  ?   ?  ?   ?  ?   ?  ?   ?  ?   ?  ?   ?  ?   ?  ?  ?     Right  ?Left  ?   ?  ?   ?  ?   ?  ?   ?  ?   ?  ?   ?  ?   ?  ?  ? ?MCA = Middle Cerebral Artery      OPHT = Opthalmic Artery     BASILAR = Basilar Artery   ?ACA = Anterior Cerebral Artery     SIPH = Carotid Siphon ?PCA = Posterior Cerebral Artery   VERT = Verterbral Artery                  ? ?Normal ?MCA = 62+\-12 ?ACA = 50+\-12 ?PCA = 42+\-23  ? ?*= Unable to insonate ? ?Jean Rosenthal, RDMS, RVT ? ? ? ?

## 2021-11-11 NOTE — Progress Notes (Signed)
Patient ID: Debbie Armstrong, female   DOB: 03/12/1986, 36 y.o.   MRN: [id number] ?Alert and oriented ?Nauseated, no emesis ?Moving all extremities well ?Perrl, full eom ?

## 2021-11-12 MED ORDER — DIAZEPAM 5 MG PO TABS
5.0000 mg | ORAL_TABLET | Freq: Four times a day (QID) | ORAL | Status: DC | PRN
  Administered 2021-11-12 – 2021-11-16 (×3): 5 mg via ORAL
  Filled 2021-11-12 (×3): qty 1

## 2021-11-12 NOTE — Progress Notes (Signed)
? ? ?Referring Physician(s): Dr. Ashok Pall ? ?Supervising Physician: Pedro Earls ? ?Patient Status:  [facility] - In-pt ? ?Chief Complaint: ? 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 Karenann Cai on 11/08/21.  ? ?Subjective: ? ?Patient laying in bed, NAD. RN and family member at the bedside.  ?States she is doing "so-so" today, that she had of little of headache last night but it was not bad.  ?No N/V.  ?Patient's main complaint this morning is the soreness in her thighs, not sure there it is coming from but thinks maybe because she is laying in bed all the time.  ?RN states that patient does not have PT/OT eval order but she had been able to walk in her room well.  ? ?Allergies: ?Patient has no known allergies. ? ?Medications: ?Prior to Admission medications   ?Medication Sig Start Date End Date Taking? Authorizing Provider  ?Multiple Vitamins-Minerals (HAIR SKIN & NAILS ADVANCED) TABS Take 1 tablet by mouth daily.   Yes [provider]  ? ? ? ?Vital Signs: ?BP 116/68   Pulse (!) 59   Temp 98.4 ?F (36.9 ?C) (Oral)   Resp 18   Ht 5\' 6"  (1.676 m)   Wt 205 lb (93 kg)   SpO2 100%   BMI 33.09 kg/m?  ? ?Physical Exam ?Vitals reviewed.  ?Constitutional:   ?   General: She is not in acute distress. ?   Appearance: Normal appearance. She is not ill-appearing.  ?Cardiovascular:  ?   Rate and Rhythm: Normal rate and regular rhythm.  ?Pulmonary:  ?   Effort: Pulmonary effort is normal.  ?Skin: ?   General: Skin is warm and dry.  ?Neurological:  ?   Mental Status: She is alert and oriented to person, place, and time.  ?Psychiatric:     ?   Mood and Affect: Mood normal.     ?   Behavior: Behavior normal.  ? ? ?Imaging: ?CT Angio Head W or Wo Contrast ? ?Result Date: 11/08/2021 ?CLINICAL DATA:  Acute subarachnoid hemorrhage, aneurysmal pattern. EXAM: CT ANGIOGRAPHY HEAD TECHNIQUE: Multidetector CT imaging of the  head was performed using the standard protocol during bolus administration of intravenous contrast. Multiplanar CT image reconstructions and MIPs were obtained to evaluate the vascular anatomy. RADIATION DOSE REDUCTION: This exam was performed according to the departmental dose-optimization program which includes automated exposure control, adjustment of the mA and/or kV according to patient size and/or use of iterative reconstruction technique. CONTRAST:  118mL OMNIPAQUE IOHEXOL 350 MG/ML SOLN COMPARISON:  Head CT earlier same day. FINDINGS: CTA HEAD Anterior circulation: The right cervical internal carotid artery is widely patent through the skull base and siphon regions. 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 It appears that the left cervical ICA supplies the left posterior cerebral artery and may give a tiny contribution to the left anterior and middle cerebral vessels, but that the majority of the flow to the left anterior circulation comes from the right carotid circulation through the anterior communicating artery complex. There appears to be an irregular aneurysm at the anterior communicating artery, difficult to measure precisely, but probably on the order of 4 mm in size. Posterior circulation: Both vertebral arteries are patent through the foramen magnum to the basilar. The left is dominant. The basilar artery is a small  vessel that supplies the superior cerebellar arteries and give some contribution to the right PCA. Minimal if any contribution to the left PCA. Venous sinuses: Patent and normal. Anatomic variants: None other significant. Review of the MIP images confirms the above findings. IMPRESSION: Unusual baseline vascular anatomy. A small left internal carotid artery supplies the left posterior cerebral artery and gives a tiny contribution to the left anterior and middle  cerebral vessels. 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 Catheter angiography may be necessary to understand the exact details of this vascular anatomy. Electronically Signed   By: Nelson Chimes M.D.   On: 11/08/2021 15:37  ? ?CT HEAD WO CONTRAST (5MM) ? ?Result Date: 11/09/2021 ?CLINICAL DATA:  36 year old female presenting with aneurysmal rupture pattern of subarachnoid hemorrhage yesterday. 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 3 mm paraophthalmic aneurysm. EXAM: CT HEAD WITHOUT CONTRAST TECHNIQUE: Contiguous axial images were obtained from the base of the skull through the vertex without intravenous contrast. RADIATION DOSE REDUCTION: This exam was performed according to the departmental dose-optimization program which includes automated exposure control, adjustment of the mA and/or kV according to patient size and/or use of iterative reconstruction technique. COMPARISON:  Presentation head CT 11/08/2021. FINDINGS: Brain: Aneurysmal pattern subarachnoid hemorrhage stable since yesterday, relatively sparing the cisterna magna and convexities. Small volume of IVH layering now in the lateral ventricles. No ventriculomegaly. No midline shift. No cortically based acute infarct identified. Stable gray-white matter differentiation throughout the brain. Unusual configuration of the anterior 3rd ventricle and suprasellar cistern, as well as prominent scattered dural calcifications. Some of the suprasellar cistern anatomy might be obscured by isodense blood also. Vascular: Multiple small anterior communicating artery region aneurysm coil packs now. No suspicious intracranial vascular hyperdensity. Skull: No acute osseous abnormality identified. Sinuses/Orbits: Visualized paranasal  sinuses and mastoids are stable and well aerated. Other: Negative orbit and scalp soft tissues. IMPRESSION: 1. Stable Aneurysmal pattern SAH since presentation. Trace IVH with no ventriculomegaly or significant intracranial mass effect. 2. Several small new anterior communicating artery region endovascular coil packs. 3. No new intracranial abnormality. Electronically Signed   By: Genevie Ann M.D.   On: 11/09/2021 05:20  ? ?CT HEAD WO CONTRAST (5MM) ? ?Result Date: 11/08/2021 ?CLINICAL DATA:  Headache, severe headache and vomiting. EXAM: CT HEAD WITHOUT CONTRAST TECHNIQUE: Contiguous axial images were obtained from the base of the skull through the vertex without intravenous contrast. RADIATION DOSE REDUCTION: This exam was performed according to the departmental dose-optimization program which includes automated exposure control, adjustment of the mA and/or kV according to patient size and/or use of iterative reconstruction technique. COMPARISON:  None. FINDINGS: Brain: Acute subarachnoid hemorrhage extending from the suprasellar cistern to the bilateral sylvian fissures and along the anterior falx, most dense at the suprasellar cistern suggesting aneurysm rupture. Small amount of additional acute hemorrhage within the third and fourth ventricle. No parenchymal hemorrhage or parenchymal edema is identified. 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 Skull: Normal. Negative for fracture or focal lesion. Sinuses/Orbits: No acute finding. Other: None. IMPRESSION: 1. 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 Small amount of additional acute hemorrhage within the third and fourth ventricle. Recommend immediate neurosurgery consultation and CT  angiogram for more definitive characterization of the intracranial vasculature. 2. No parenchymal  hemorrhage or parenchymal edema is identified. Critical Value/emergent results were called by telephone at the time of interpretation on 11/08/2021 at 1:28 pm to provider [facility] , who verbally acknowledged these results. Ele

## 2021-11-12 NOTE — Progress Notes (Signed)
Patient ID: Debbie Armstrong, female   DOB: [birth year], 36 y.o.   MRN: [id number] ?BP 119/76   Pulse 60   Temp 99.1 ?F (37.3 ?C) (Oral)   Resp 16   Ht 5\' 6"  (1.676 m)   Wt 93 kg   SpO2 100%   BMI 33.09 kg/m?  ?Alert and oriented x4 ?Complaining of leg heaviness, discomfort. Given valiium ?Alert and oriented x 4, speech is clear and fluent ?Moving all extremities ?

## 2021-11-13 ENCOUNTER — Inpatient Hospital Stay (HOSPITAL_COMMUNITY): Payer: BC Managed Care – PPO

## 2021-11-13 DIAGNOSIS — I671 Cerebral aneurysm, nonruptured: Secondary | ICD-10-CM

## 2021-11-13 NOTE — Progress Notes (Addendum)
Transcranial Doppler ? ?Date POD PCO2 HCT BP  MCA ACA PCA OPHT SIPH VERT Basilar  ?4/10 ?RH     Right  ?Left  ? *  ?*  ? *  ?*  ? *  ?*  ? 26  ?22  ? 27  ?26  ? -34  ?-21  ? -44  ?  ?  ?4/12 ?RH     Right  ?Left  ? *  ?*  ? *  ?*  ? *  ?*  ? 20  ?21  ? 25  ?27  ? -40  ?-27  ? -60  ?  ?  ?4/14 MS     Right  ?Left  ? 32  ?*  ? *  ?*  ? *  ?*  ? 17  ?14  ? 64  ?28  ? -36  ?-15  ? -47  ?  ?  ?4/17 MS     Right  ?Left  ? 44  ?*  ? *  ?*  ? *  ?*  ? 19  ?25  ? 25  ?33  ? -28  ?-34  ? -69  ?  ?  ? ?     Right  ?Left  ?   ?  ?   ?  ?   ?  ?   ?  ?   ?  ?   ?  ?   ?  ?  ?     Right  ?Left  ?   ?  ?   ?  ?   ?  ?   ?  ?   ?  ?   ?  ?   ?  ?  ?     Right  ?Left  ?   ?  ?   ?  ?   ?  ?   ?  ?   ?  ?   ?  ?   ?  ?  ? ?MCA = Middle Cerebral Artery      OPHT = Opthalmic Artery     BASILAR = Basilar Artery   ?ACA = Anterior Cerebral Artery     SIPH = Carotid Siphon ?PCA = Posterior Cerebral Artery   VERT = Verterbral Artery                  ? ?Normal ?MCA = 62+\-12 ?ACA = 50+\-12 ?PCA = 42+\-23  ? ?*= Unable to insonate ? ?11/16/21 Right Lindegaard ratio= 1.16, unable to calculate left due to inability to insonate left MCA. ? ?11/16/2021 3:07 PM ?Gertie Fey, MHA, RVT, RDCS, RDMS  ? ? ? ?

## 2021-11-13 NOTE — Progress Notes (Signed)
Patient ID: Debbie Armstrong, female   DOB: [birth year], 36 y.o.   MRN: [id number] ?BP 124/74   Pulse (!) 55   Temp 98.8 ?F (37.1 ?C) (Axillary)   Resp 15   Ht 5\' 6"  (1.676 m)   Wt 93 kg   SpO2 99%   BMI 33.09 kg/m?  ?Alert, oriented ?Moving all extremities ?Perrl, full eom ?Symmetric facies, tongue and uvula midline ?Headaches remain, may have improved slightly ?Continue current measures.  ?

## 2021-11-13 NOTE — Plan of Care (Signed)
?  Problem: Education: ?Goal: Knowledge of disease or condition will improve ?Outcome: Progressing ?  ?Problem: Coping: ?Goal: Will verbalize positive feelings about self ?Outcome: Progressing ?  ?Problem: Health Behavior/Discharge Planning: ?Goal: Ability to manage health-related needs will improve ?Outcome: Progressing ?  ?Problem: Self-Care: ?Goal: Ability to participate in self-care as condition permits will improve ?Outcome: Progressing ?  ?Problem: Nutrition: ?Goal: Risk of aspiration will decrease ?Outcome: Progressing ?  ?Problem: Spontaneous Subarachnoid Hemorrhage Tissue Perfusion: ?Goal: Complications of Spontaneous Subarachnoid Hemorrhage will be minimized ?Outcome: Progressing ?  ?

## 2021-11-13 NOTE — Progress Notes (Signed)
? ? ?Referring Physician(s): ?Dr. Coletta Memos ? ?Supervising Physician: Baldemar Lenis ? ?Patient Status:  [facility] - In-pt ? ?Chief Complaint: ?Subarachnoid hemorrhage ? ?Subjective: ?Assessed alongside Dr. Tommie Sams. ?Lying in bed. Curtains drawn.  Rates headache at 3-4/10. ?Thigh heaviness/weakness resolved with SCDs.  ?No further weakness, focal deficits.  ?Working to improve appetite and was able to eat a full meal today. ?Aunt at bedside states she seems much improved today.  ? ?Allergies: ?Patient has no known allergies. ? ?Medications: ?Prior to Admission medications   ?Medication Sig Start Date End Date Taking? Authorizing Provider  ?Multiple Vitamins-Minerals (HAIR SKIN & NAILS ADVANCED) TABS Take 1 tablet by mouth daily.   Yes [provider]  ? ? ? ?Vital Signs: ?BP (!) 141/80   Pulse 74   Temp 98.5 ?F (36.9 ?C) (Oral)   Resp 15   Ht 5\' 6"  (1.676 m)   Wt 205 lb (93 kg)   SpO2 99%   BMI 33.09 kg/m?  ? ?Physical Exam ?NAD, alert ?Neuro: alert, oriented, conversant, speech clear, moving all extremities.  ? ?Imaging: ?VAS TRANSCRANIAL DOPPLER ? ?Result Date: 11/13/2021 ? Transcranial Doppler Patient Name:  Debbie Armstrong  Date of Exam:   11/13/2021 Medical Rec #: [id number]    Accession #:    [id number] Date of Birth: 06-Apr-1986    Patient Gender: F Patient Age:   36 years Exam Location:  [facility] Procedure:      VAS [facility] TRANSCRANIAL DOPPLER Referring Phys: [place] CABBELL --------------------------------------------------------------------------------  Indications: Subarachnoid hemorrhage. Limitations for diagnostic windows: Unable to insonate left transtemporal window. Comparison Study: 11/11/2020 TCD Performing Technologist: 01/11/2021 MHA, RDMS, RVT, RDCS  Examination Guidelines: A complete evaluation includes B-mode imaging, spectral Doppler, color Doppler, and power Doppler as needed of all accessible portions of each vessel. Bilateral testing is  considered an integral part of a complete examination. Limited examinations for reoccurring indications may be performed as noted.  +----------+-------------+----------+-----------+------------------+ RIGHT TCD Right VM (cm)Depth (cm)Pulsatility     Comment       +----------+-------------+----------+-----------+------------------+ MCA           32.00                 1.09                       +----------+-------------+----------+-----------+------------------+ ACA                                         Unable to insonate +----------+-------------+----------+-----------+------------------+ Term ICA                                    Unable to insonate +----------+-------------+----------+-----------+------------------+ PCA                                         Unable to insonate +----------+-------------+----------+-----------+------------------+ Opthalmic     17.00                 1.39                       +----------+-------------+----------+-----------+------------------+ ICA siphon    64.00  0.80                       +----------+-------------+----------+-----------+------------------+ Vertebral    -36.00                 0.99                       +----------+-------------+----------+-----------+------------------+ Distal ICA    40.00                                            +----------+-------------+----------+-----------+------------------+  +----------+------------+----------+-----------+------------------+ LEFT TCD  Left VM (cm)Depth (cm)Pulsatility     Comment       +----------+------------+----------+-----------+------------------+ MCA                                        Unable to insonate +----------+------------+----------+-----------+------------------+ ACA                                        Unable to insonate +----------+------------+----------+-----------+------------------+ Term ICA                                    Unable to insonate +----------+------------+----------+-----------+------------------+ PCA                                        Unable to insonate +----------+------------+----------+-----------+------------------+ Opthalmic    14.00                 1.31                       +----------+------------+----------+-----------+------------------+ ICA siphon   28.00                 1.75                       +----------+------------+----------+-----------+------------------+ Vertebral    -15.00                1.07                       +----------+------------+----------+-----------+------------------+  +------------+------+-------+             VM cm Comment +------------+------+-------+ Prox Basilar-44.00        +------------+------+-------+ Dist Basilar-47.00        +------------+------+-------+ +----------------------+---+ Right Lindegaard Ratio0.8 +----------------------+---+    Preliminary   ? ?VAS [place] TRANSCRANIAL DOPPLER ? ?Result Date: 11/11/2021 ? Transcranial Doppler Patient Name:  Debbie Armstrong  Date of Exam:   11/11/2021 Medical Rec #: [id number]    Accession #:    [id number] Date of Birth: 05-21-1986    Patient Gender: F Patient Age:   36 years Exam Location:  [facility] Procedure:      VAS [place] TRANSCRANIAL DOPPLER Referring Phys: Ronaldo Miyamoto CABBELL --------------------------------------------------------------------------------  Indications: Subarachnoid hemorrhage. Limitations for diagnostic windows: Unable to insonate right transtemporal window. Unable to insonate left transtemporal window. Performing Technologist: Jean Rosenthal RDMS, RVT  Examination Guidelines: A  complete evaluation includes B-mode imaging, spectral Doppler, color Doppler, and power Doppler as needed of all accessible portions of each vessel. Bilateral testing is considered an integral part of a complete examination. Limited examinations for reoccurring indications may be performed  as noted.  +----------+-------------+----------+-----------+------------------+ RIGHT TCD Right VM (cm)Depth (cm)Pulsatility     Comment       +----------+-------------+----------+-----------+------------------+ MCA                                         Unable to insonate +----------+-------------+----------+-----------+------------------+ ACA                                         Unable to insonate +----------+-------------+----------+-----------+------------------+ Term ICA                                    Unable to insonate +----------+-------------+----------+-----------+------------------+ PCA                                         Unable to insonate +----------+-------------+----------+-----------+------------------+ Opthalmic     20.00                 1.38                       +----------+-------------+----------+-----------+------------------+ ICA siphon    25.00                 1.77                       +----------+-------------+----------+-----------+------------------+ Vertebral    -40.00                 0.65                       +----------+-------------+----------+-----------+------------------+  +----------+------------+----------+-----------+------------------+ LEFT TCD  Left VM (cm)Depth (cm)Pulsatility     Comment       +----------+------------+----------+-----------+------------------+ MCA                                        Unable to insonate +----------+------------+----------+-----------+------------------+ ACA                                        Unable to insonate +----------+------------+----------+-----------+------------------+ Term ICA                                   Unable to insonate +----------+------------+----------+-----------+------------------+ PCA                                        Unable to insonate +----------+------------+----------+-----------+------------------+ Opthalmic     21.00                 1.49                       +----------+------------+----------+-----------+------------------+  ICA siphon   27.00                 1.87                       +----------+------------+----------+-----------+------------------+ Vertebral    -27.00                0.72                       +----------+------------+----------+-----------+------------------+  +------------+------+-------+

## 2021-11-14 MED ORDER — BISACODYL 5 MG PO TBEC
5.0000 mg | DELAYED_RELEASE_TABLET | Freq: Every day | ORAL | Status: DC | PRN
Start: 2021-11-14 — End: 2021-11-25
  Administered 2021-11-15: 5 mg via ORAL
  Filled 2021-11-14: qty 1

## 2021-11-14 NOTE — Plan of Care (Signed)
°  Problem: Education: °Goal: Knowledge of General Education information will improve °Description: Including pain rating scale, medication(s)/side effects and non-pharmacologic comfort measures °Outcome: Progressing °  °Problem: Education: °Goal: Knowledge of disease or condition will improve °Outcome: Progressing °Goal: Knowledge of secondary prevention will improve (SELECT ALL) °Outcome: Progressing °  °

## 2021-11-14 NOTE — Progress Notes (Signed)
NEUROSURGERY PROGRESS NOTE ? ?Doing well s/p aneurysm coiling. Currently in the shower assisted by her family. Continues to have headaches. Continue supportive care.  ? ?Temp:  [98.1 ?F (36.7 ?C)-99.8 ?F (37.7 ?C)] 98.1 ?F (36.7 ?C) (04/15 0800) ?Pulse Rate:  [47-74] 58 (04/15 0600) ?Resp:  [11-24] 17 (04/15 0600) ?BP: (107-141)/(55-91) 111/72 (04/15 0600) ?SpO2:  [94 %-100 %] 98 % (04/15 0600) ? ? ? ?Debbie Chiquito, NP ?11/14/2021 ?9:33 AM ?  ?

## 2021-11-14 NOTE — Progress Notes (Signed)
? ? ?Referring Physician(s): ?Dr Franky Macho ? ?Supervising Physician: Baldemar Lenis ? ?Patient Status:  [facility] - In-pt ? ?Chief Complaint: ? ?SAH ?NIR procedure 11/08/21: 3 small ACOM aneurysm embolization ? ?Subjective: ? ?    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 ?    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 ? ?Doing well ?Able to shower independently this am ?Still with minimal headache ?No other complaints ? ? ?Allergies: ?Patient has no known allergies. ? ?Medications: ?Prior to Admission medications   ?Medication Sig Start Date End Date Taking? Authorizing Provider  ?Multiple Vitamins-Minerals (HAIR SKIN & NAILS ADVANCED) TABS Take 1 tablet by mouth daily.   Yes [provider]  ? ? ? ?Vital Signs: ?BP (!) 128/95   Pulse 82   Temp 98.1 ?F (36.7 ?C) (Axillary)   Resp 16   Ht  (1.676 m)   Wt 205 lb (93 kg)   SpO2 94%   BMI 33.09 kg/m?  ? ?Physical Exam ?Vitals reviewed.  ?Eyes:  ?   Extraocular Movements: Extraocular movements intact.  ?Musculoskeletal:     ?   General: No swelling or tenderness. Normal range of motion.  ?   Cervical back: Normal range of motion.  ?   Right lower leg: No edema.  ?   Left lower leg: No edema.  ?Skin: ?   General: Skin is warm.  ?Neurological:  ?   Mental Status: She is alert and oriented to person, place, and time.  ?Psychiatric:     ?   Behavior: Behavior normal.  ? ? ?Imaging: ?VAS [place] TRANSCRANIAL DOPPLER ? ?Result Date: 11/13/2021 ? Transcranial Doppler Patient Name:  Debbie Armstrong  Date of Exam:   11/13/2021 Medical Rec #: [id number]    Accession #:    [id number] Date of Birth: Feb 19, 1986    Patient Gender: F Patient Age:   36 years Exam Location:  [facility] Procedure:      VAS [place] TRANSCRANIAL DOPPLER Referring Phys: Ronaldo Miyamoto CABBELL  --------------------------------------------------------------------------------  Indications: Subarachnoid hemorrhage. Limitations for diagnostic windows: Unable to insonate left transtemporal window. Comparison Study: 11/11/2020 TCD Performing Technologist: Gertie Fey MHA, RDMS, RVT, RDCS  Examination Guidelines: A complete evaluation includes B-mode imaging, spectral Doppler, color Doppler, and power Doppler as needed of all accessible portions of each vessel. Bilateral testing is considered an integral part of a complete examination. Limited examinations for reoccurring indications may be performed as noted.  +----------+-------------+----------+-----------+------------------+ RIGHT TCD Right VM (cm)Depth (cm)Pulsatility     Comment       +----------+-------------+----------+-----------+------------------+ MCA           32.00                 1.09                       +----------+-------------+----------+-----------+------------------+ ACA                                         Unable to insonate +----------+-------------+----------+-----------+------------------+ Term ICA                                    Unable to insonate +----------+-------------+----------+-----------+------------------+ PCA  Unable to insonate +----------+-------------+----------+-----------+------------------+ Opthalmic     17.00                 1.39                       +----------+-------------+----------+-----------+------------------+ ICA siphon    64.00                 0.80                       +----------+-------------+----------+-----------+------------------+ Vertebral    -36.00                 0.99                       +----------+-------------+----------+-----------+------------------+ Distal ICA    40.00                                            +----------+-------------+----------+-----------+------------------+   +----------+------------+----------+-----------+------------------+ LEFT TCD  Left VM (cm)Depth (cm)Pulsatility     Comment       +----------+------------+----------+-----------+------------------+ MCA                                        Unable to insonate +----------+------------+----------+-----------+------------------+ ACA                                        Unable to insonate +----------+------------+----------+-----------+------------------+ Term ICA                                   Unable to insonate +----------+------------+----------+-----------+------------------+ PCA                                        Unable to insonate +----------+------------+----------+-----------+------------------+ Opthalmic    14.00                 1.31                       +----------+------------+----------+-----------+------------------+ ICA siphon   28.00                 1.75                       +----------+------------+----------+-----------+------------------+ Vertebral    -15.00                1.07                       +----------+------------+----------+-----------+------------------+  +------------+------+-------+             VM cm Comment +------------+------+-------+ Prox Basilar-44.00        +------------+------+-------+ Dist Basilar-47.00        +------------+------+-------+ +----------------------+---+ Right Lindegaard Ratio0.8 +----------------------+---+    Preliminary   ? ?VAS [place] TRANSCRANIAL DOPPLER ? ?Result Date: 11/11/2021 ? Transcranial Doppler Patient Name:  Debbie Armstrong  Date of Exam:   11/11/2021 Medical Rec #: [id number]    Accession #:  [id number] Date of Birth: 08/17/1985    Patient Gender: F Patient Age:   36 years [facility] Procedure:      VAS US TRANSCRANIAL DOPPLER Referring Phys: Ronaldo MiyamotoKYLE CABBELL --------------------------------------------------------------------------------  Indications: Subarachnoid  hemorrhage. Limitations for diagnostic windows: Unable to insonate right transtemporal window. Unable to insonate left transtemporal window. Performing Technologist: Jean Rosenthalachel Hodge RDMS, RVT  Examination Guidelines: A complete evaluation includes B-mode imaging, spectral Doppler, color Doppler, and power Doppler as needed of all accessible portions of each vessel. Bilateral testing is considered an integral part of a complete examination. Limited examinations for reoccurring indications may be performed as noted.  +----------+-------------+----------+-----------+------------------+ RIGHT TCD Right VM (cm)Depth (cm)Pulsatility     Comment       +----------+-------------+----------+-----------+------------------+ MCA                                         Unable to insonate +----------+-------------+----------+-----------+------------------+ ACA                                         Unable to insonate +----------+-------------+----------+-----------+------------------+ Term ICA                                    Unable to insonate +----------+-------------+----------+-----------+------------------+ PCA                                         Unable to insonate +----------+-------------+----------+-----------+------------------+ Opthalmic     20.00                 1.38                       +----------+-------------+----------+-----------+------------------+ ICA siphon    25.00                 1.77                       +----------+-------------+----------+-----------+------------------+ Vertebral    -40.00                 0.65                       +----------+-------------+----------+-----------+------------------+  +----------+------------+----------+-----------+------------------+ LEFT TCD  Left VM (cm)Depth (cm)Pulsatility     Comment       +----------+------------+----------+-----------+------------------+ MCA                                         Unable to insonate +----------+------------+----------+-----------+------------------+ ACA                                        Unable to insonate +----------+------------+----------+-----------+------------------+ Term ICA                                   Unable to insonate +----------+------------+----------+-----------+------------------+ PCA  Unable to insonate +--------

## 2021-11-15 MED ORDER — METHOCARBAMOL 500 MG PO TABS
500.0000 mg | ORAL_TABLET | Freq: Four times a day (QID) | ORAL | Status: DC | PRN
  Administered 2021-11-15 – 2021-11-18 (×9): 500 mg via ORAL
  Filled 2021-11-15 (×9): qty 1

## 2021-11-15 NOTE — Progress Notes (Signed)
Called in to pt's room with c/o weird tingling in right hand and then right side of face.  She said the right hand tingling was gone when I arrived in the room but face still felt a bit numb and tingling.  Within 2-3 minutes, the symptoms subsided. ?Dr Christella Noa was called and notified of episode. ?Will continue to monitor patient. ?Bernie Fobes C ?8:52 AM ? ?

## 2021-11-15 NOTE — Progress Notes (Signed)
? ? ?Referring Physician(s): ?Dr Franky Machoabbell ? ?Supervising Physician: Baldemar Lenise Macedo Rodrigues, [place] ? ?Patient Status:  [facility] - [facility] ? ?Chief Complaint: ? ?SAH ?NIR procedure 11/08/21: 3 small ACOM aneurysm embolization ? ?Subjective: ? ?Only mild headache at this point ?Did have tingling sensation in Rt hand and Rt face this am ?Lasted few minutes-- resolved and has not recurred ?Dr Franky Machoabbell aware ? ? ? ?Allergies: ?Patient has no known allergies. ? ?Medications: ?Prior to Admission medications   ?Medication Sig Start Date End Date Taking? Authorizing Provider  ?Multiple Vitamins-Minerals (HAIR SKIN & NAILS ADVANCED) TABS Take 1 tablet by mouth daily.   Yes [provider]  ? ? ? ?Vital Signs: ?BP 137/81   Pulse 70   Temp 98.8 ?F (37.1 ?C) (Oral)   Resp 11   Ht 5\' 6"  (1.676 m)   Wt 205 lb (93 kg)   SpO2 100%   BMI 33.09 kg/m?  ? ?A/O ?Smile = ?Puffs cheeks = ?Moves all 4s ?Independent in room ? ? ?Imaging: ?VAS US TRANSCRANIAL DOPPLER ? ?Result Date: 11/13/2021 ? Transcranial Doppler Patient Name:  Debbie BouillonGNES Scalisi  Date of Exam:   11/13/2021 Medical Rec #: [id number]    Accession #:    [id number] Date of Birth: 11/07/1985    Patient Gender: F Patient Age:   36 years [facility] Procedure:      VAS US TRANSCRANIAL DOPPLER Referring Phys: Ronaldo MiyamotoKYLE CABBELL --------------------------------------------------------------------------------  Indications: Subarachnoid hemorrhage. Limitations for diagnostic windows: Unable to insonate left transtemporal window. Comparison Study: 11/11/2020 TCD Performing Technologist: Gertie FeyMichelle Simonetti MHA, RDMS, RVT, RDCS  Examination Guidelines: A complete evaluation includes B-mode imaging, spectral Doppler, color Doppler, and power Doppler as needed of all accessible portions of each vessel. Bilateral testing is considered an integral part of a complete examination. Limited examinations for reoccurring indications may be performed as noted.   +----------+-------------+----------+-----------+------------------+ RIGHT TCD Right VM (cm)Depth (cm)Pulsatility     Comment       +----------+-------------+----------+-----------+------------------+ MCA           32.00                 1.09                       +----------+-------------+----------+-----------+------------------+ ACA                                         Unable to insonate +----------+-------------+----------+-----------+------------------+ Term ICA                                    Unable to insonate +----------+-------------+----------+-----------+------------------+ PCA                                         Unable to insonate +----------+-------------+----------+-----------+------------------+ Opthalmic     17.00                 1.39                       +----------+-------------+----------+-----------+------------------+ ICA siphon    64.00                 0.80                       +----------+-------------+----------+-----------+------------------+  Vertebral    -36.00                 0.99                       +----------+-------------+----------+-----------+------------------+ Distal ICA    40.00                                            +----------+-------------+----------+-----------+------------------+  +----------+------------+----------+-----------+------------------+ LEFT TCD  Left VM (cm)Depth (cm)Pulsatility     Comment       +----------+------------+----------+-----------+------------------+ MCA                                        Unable to insonate +----------+------------+----------+-----------+------------------+ ACA                                        Unable to insonate +----------+------------+----------+-----------+------------------+ Term ICA                                   Unable to insonate +----------+------------+----------+-----------+------------------+ PCA                                         Unable to insonate +----------+------------+----------+-----------+------------------+ Opthalmic    14.00                 1.31                       +----------+------------+----------+-----------+------------------+ ICA siphon   28.00                 1.75                       +----------+------------+----------+-----------+------------------+ Vertebral    -15.00                1.07                       +----------+------------+----------+-----------+------------------+  +------------+------+-------+             VM cm Comment +------------+------+-------+ Prox Basilar-44.00        +------------+------+-------+ Dist Basilar-47.00        +------------+------+-------+ +----------------------+---+ Right Lindegaard Ratio0.8 +----------------------+---+    Preliminary    ? ?Labs: ? ?CBC: ?Recent Labs  ?  11/08/21 ?1334 11/08/21 ?1720  ?WBC 15.7* 18.1*  ?HGB 13.6 13.8  ?HCT 41.5 42.9  ?PLT 245 264  ? ? ?COAGS: ?Recent Labs  ?  11/08/21 ?1626  ?INR 1.1  ?APTT 24  ? ? ?BMP: ?Recent Labs  ?  11/08/21 ?1334  ?NA 138  ?K 3.2*  ?CL 110  ?CO2 20*  ?GLUCOSE 115*  ?BUN 11  ?CALCIUM 8.5*  ?CREATININE 0.68  ?GFRNONAA >60  ? ? ?LIVER FUNCTION TESTS: ?No results for input(s): BILITOT, AST, ALT, ALKPHOS, PROT, ALBUMIN in the last 8760 hours. ? ?Assessment and Plan: ? ?SAH ?NIR procedure 11/08/21: 3 small ACOM aneurysm embolization ?Will follow with Dr Franky Macho ?  ? ?  Electronically Signed: ?Robet Leu, PA-C ?11/15/2021, 12:57 PM ? ? ?I spent a total of 15 Minutes at the the patient's bedside AND on the patient's hospital floor or unit, greater than 50% of which was counseling/coordinating care for  [facility] aneurysm (x3) embolization ? ? ? ?  ?

## 2021-11-15 NOTE — Progress Notes (Addendum)
NEUROSURGERY PROGRESS NOTE ? ?S/p aneurysm coiling. Doing well. Headaches are much better today. Continue to mobilize.  ? ?Temp:  [98.5 ?F (36.9 ?C)-99 ?F (37.2 ?C)] 98.8 ?F (37.1 ?C) (04/16 0800) ?Pulse Rate:  [52-82] 70 (04/16 0900) ?Resp:  [11-24] 11 (04/16 0900) ?BP: (102-139)/(51-96) 137/81 (04/16 0900) ?SpO2:  [92 %-100 %] 100 % (04/16 0900) ? ? ? ?Sherryl Manges, NP ?11/15/2021 ?9:18 AM ?  ?

## 2021-11-16 ENCOUNTER — Inpatient Hospital Stay (HOSPITAL_COMMUNITY): Payer: BC Managed Care – PPO

## 2021-11-16 DIAGNOSIS — I671 Cerebral aneurysm, nonruptured: Secondary | ICD-10-CM

## 2021-11-16 MED ORDER — BISACODYL 10 MG RE SUPP
10.0000 mg | Freq: Every day | RECTAL | Status: DC | PRN
  Administered 2021-11-16: 10 mg via RECTAL
  Filled 2021-11-16: qty 1

## 2021-11-16 MED ORDER — POLYETHYLENE GLYCOL 3350 17 G PO PACK
17.0000 g | PACK | Freq: Every day | ORAL | Status: DC
  Administered 2021-11-16 – 2021-11-18 (×3): 17 g via ORAL
  Filled 2021-11-16 (×5): qty 1

## 2021-11-16 NOTE — Progress Notes (Signed)
? ? ?Referring Physician(s): ?Dr. Coletta Memos ? ?Supervising Physician: Baldemar Lenis ? ?Patient Status:  Debbie Armstrong ? ?Chief Complaint: ?Subarachnoid hemorrhage ? ?Subjective: ?Assessed alongside Dr. Tommie Sams. ?Lying in bed. Curtains drawn. Working to improve appetite. ?Complains of leg cramps; being given muscle relaxers and pain medication.  ?Aunt at bedside.  ? ?Allergies: ?Patient has no known allergies. ? ?Medications: ?Prior to Admission medications   ?Medication Sig Start Date End Date Taking? Authorizing Provider  ?Multiple Vitamins-Minerals (HAIR SKIN & NAILS ADVANCED) TABS Take 1 tablet by mouth daily.   Yes [provider]  ? ? ? ?Vital Signs: ?BP 132/90   Pulse 89   Temp 98.2 ?F (36.8 ?C) (Oral)   Resp (!) 23   Ht  (1.676 m)   Wt 205 lb (93 kg)   SpO2 97%   BMI 33.09 kg/m?  ? ?Physical Exam ?NAD, alert ?Neuro: alert, oriented, conversant, speech clear, moving all extremities.  ? ?Imaging: ?VAS [place] TRANSCRANIAL DOPPLER ? ?Result Date: 11/16/2021 ? Transcranial Doppler Patient Name:  Debbie Armstrong  Date of Exam:   11/16/2021 Medical Rec #: [id number]    Accession #:    [id number] Date of Birth: 24-Mar-1986    Patient Gender: F Patient Age:   36 years Exam Location:  [facility] Procedure:      VAS [place] TRANSCRANIAL DOPPLER Referring Phys: Ronaldo Miyamoto CABBELL --------------------------------------------------------------------------------  Indications: Subarachnoid hemorrhage. Limitations for diagnostic windows: Unable to insonate left transtemporal window. Comparison Study: 11/13/2021 TCD Performing Technologist: Gertie Fey MHA, RDMS, RVT, RDCS  Examination Guidelines: A complete evaluation includes B-mode imaging, spectral Doppler, color Doppler, and power Doppler as needed of all accessible portions of each vessel. Bilateral testing is considered an integral part of a complete examination. Limited examinations for reoccurring indications may be performed  as noted.  +----------+-------------+----------+-----------+------------------+ RIGHT TCD Right VM (cm)Depth (cm)Pulsatility     Comment       +----------+-------------+----------+-----------+------------------+ MCA           44.00       4.10      1.14                       +----------+-------------+----------+-----------+------------------+ ACA                                         Unable to insonate +----------+-------------+----------+-----------+------------------+ Term ICA                                    Unable to insonate +----------+-------------+----------+-----------+------------------+ PCA                                         Unable to insonate +----------+-------------+----------+-----------+------------------+ Opthalmic     19.00                 1.50                       +----------+-------------+----------+-----------+------------------+ ICA siphon    25.00                 1.46                       +----------+-------------+----------+-----------+------------------+  Vertebral    -28.00                 1.07                       +----------+-------------+----------+-----------+------------------+ Distal ICA    38.00                                            +----------+-------------+----------+-----------+------------------+  +----------+------------+----------+-----------+------------------+ LEFT TCD  Left VM (cm)Depth (cm)Pulsatility     Comment       +----------+------------+----------+-----------+------------------+ MCA                                        Unable to insonate +----------+------------+----------+-----------+------------------+ ACA                                        Unable to insonate +----------+------------+----------+-----------+------------------+ Term ICA                                   Unable to insonate +----------+------------+----------+-----------+------------------+ PCA                                         Unable to insonate +----------+------------+----------+-----------+------------------+ Opthalmic    25.00                 1.21                       +----------+------------+----------+-----------+------------------+ ICA siphon   33.00                 1.62                       +----------+------------+----------+-----------+------------------+ Vertebral    -34.00                0.99                       +----------+------------+----------+-----------+------------------+  +------------+------+-------+             VM cm Comment +------------+------+-------+ Prox Basilar-59.00        +------------+------+-------+ Dist Basilar-69.00        +------------+------+-------+ +----------------------+----+ Right Lindegaard Ratio1.16 +----------------------+----+    Preliminary   ? ?VAS [place] TRANSCRANIAL DOPPLER ? ?Result Date: 11/13/2021 ? Transcranial Doppler Patient Name:  Debbie Armstrong  Date of Exam:   11/13/2021 Medical Rec #: [id number]    Accession #:    [id number] Date of Birth: [birth year]    Patient Gender: F Patient Age:   4 years Exam Location:  [facility] Procedure:      VAS [place] TRANSCRANIAL DOPPLER Referring Phys: Ronaldo Miyamoto CABBELL --------------------------------------------------------------------------------  Indications: Subarachnoid hemorrhage. Limitations for diagnostic windows: Unable to insonate left transtemporal window. Comparison Study: 11/11/2020 TCD Performing Technologist: Gertie Fey MHA, RDMS, RVT, RDCS  Examination Guidelines: A complete evaluation includes B-mode imaging, spectral Doppler, color Doppler, and power Doppler as needed of all accessible portions of each vessel. Bilateral testing is  considered an integral part of a complete examination. Limited examinations for reoccurring indications may be performed as noted.  +----------+-------------+----------+-----------+------------------+ RIGHT TCD Right VM (cm)Depth  (cm)Pulsatility     Comment       +----------+-------------+----------+-----------+------------------+ MCA           32.00                 1.09                       +----------+-------------+----------+-----------+------------------+ ACA                                         Unable to insonate +----------+-------------+----------+-----------+------------------+ Term ICA                                    Unable to insonate +----------+-------------+----------+-----------+------------------+ PCA                                         Unable to insonate +----------+-------------+----------+-----------+------------------+ Opthalmic     17.00                 1.39                       +----------+-------------+----------+-----------+------------------+ ICA siphon    64.00                 0.80                       +----------+-------------+----------+-----------+------------------+ Vertebral    -36.00                 0.99                       +----------+-------------+----------+-----------+------------------+ Distal ICA    40.00                                            +----------+-------------+----------+-----------+------------------+  +----------+------------+----------+-----------+------------------+ LEFT TCD  Left VM (cm)Depth (cm)Pulsatility     Comment       +----------+------------+----------+-----------+------------------+ MCA                                        Unable to insonate +----------+------------+----------+-----------+------------------+ ACA                                        Unable to insonate +----------+------------+----------+-----------+------------------+ Term ICA                                   Unable to insonate +----------+------------+----------+-----------+------------------+ PCA                                        Unable to  insonate  +----------+------------+----------+-----------+------------------+ Opthalmic    14.00                 1.31                       +----------+------------+----------+-----------+------------------+ ICA siphon   28.00                 1.75                       +----------+------------+----------+-----------+------------------+ Vertebral    -15.00                1.07                       +----------+------------+----------+-----------+------------------+  +----------

## 2021-11-16 NOTE — Progress Notes (Signed)
Patient ID: Debbie Armstrong, female   DOB: October 28, 1985, 36 y.o.   MRN: [id number] ?BP 135/78 (BP Location: Left Arm)   Pulse 67   Temp 98.3 ?F (36.8 ?C) (Oral)   Resp 16   Ht 5\' 6"  (1.676 m)   Wt 93 kg   SpO2 100%   BMI 33.09 kg/m?  ?Alert and oriented x 4, speech is clear and fluent ?Moving all extremities well ?Continue hydration.  ?

## 2021-11-17 LAB — CBC WITH DIFFERENTIAL/PLATELET
Abs Immature Granulocytes: 0.03 10*3/uL (ref 0.00–0.07)
Basophils Absolute: 0.1 10*3/uL (ref 0.0–0.1)
Basophils Relative: 1 %
Eosinophils Absolute: 0.2 10*3/uL (ref 0.0–0.5)
Eosinophils Relative: 2 %
HCT: 40.1 % (ref 36.0–46.0)
Hemoglobin: 13.2 g/dL (ref 12.0–15.0)
Immature Granulocytes: 0 %
Lymphocytes Relative: 27 %
Lymphs Abs: 2.1 10*3/uL (ref 0.7–4.0)
MCH: 30.3 pg (ref 26.0–34.0)
MCHC: 32.9 g/dL (ref 30.0–36.0)
MCV: 92.2 fL (ref 80.0–100.0)
Monocytes Absolute: 0.8 10*3/uL (ref 0.1–1.0)
Monocytes Relative: 10 %
Neutro Abs: 4.6 10*3/uL (ref 1.7–7.7)
Neutrophils Relative %: 60 %
Platelets: 257 10*3/uL (ref 150–400)
RBC: 4.35 MIL/uL (ref 3.87–5.11)
RDW: 12.3 % (ref 11.5–15.5)
WBC: 7.8 10*3/uL (ref 4.0–10.5)
nRBC: 0 % (ref 0.0–0.2)

## 2021-11-17 LAB — BASIC METABOLIC PANEL
Anion gap: 9 (ref 5–15)
BUN: 8 mg/dL (ref 6–20)
CO2: 21 mmol/L — ABNORMAL LOW (ref 22–32)
Calcium: 8.7 mg/dL — ABNORMAL LOW (ref 8.9–10.3)
Chloride: 103 mmol/L (ref 98–111)
Creatinine, Ser: 0.74 mg/dL (ref 0.44–1.00)
GFR, Estimated: >60 mL/min (ref >60)
Glucose, Bld: 95 mg/dL (ref 70–99)
Potassium: 3.6 mmol/L (ref 3.5–5.1)
Sodium: 133 mmol/L — ABNORMAL LOW (ref 135–145)

## 2021-11-17 NOTE — Progress Notes (Signed)
? ? ?Referring Physician(s): ?Dr. Ashok Pall ? ?Supervising Physician: Pedro Earls ? ?Patient Status:  [facility] - In-pt ? ?Chief Complaint: ?Subarachnoid hemorrhage ? ?Subjective: ?Assessed alongside Dr. Karenann Cai. ?Lying in bed.  ?States best position seems to flat prone. ?Still primary complaint of leg cramps; being given muscle relaxers and pain medication.  ?Aunt at bedside.  ? ?Allergies: ?Patient has no known allergies. ? ?Medications: ? ?Current Facility-Administered Medications:  ?  0.9 %  sodium chloride infusion, , Intravenous, Continuous, Cabbell, Kyle, MD, Last Rate: 100 mL/hr at 11/17/21 0600, Infusion Verify at 11/17/21 0600 ?  acetaminophen (TYLENOL) tablet 650 mg, 650 mg, Oral, Q4H PRN, 650 mg at 11/16/21 0527 **OR** acetaminophen (TYLENOL) 160 MG/5ML solution 650 mg, 650 mg, Per Tube, Q4H PRN **OR** acetaminophen (TYLENOL) suppository 650 mg, 650 mg, Rectal, Q4H PRN, de Sindy Messing, Erven Colla, MD ?  acetaminophen-codeine (TYLENOL #3) 300-30 MG per tablet 1-2 tablet, 1-2 tablet, Oral, Q4H PRN, Ashok Pall, MD, 2 tablet at 11/17/21 0731 ?  aspirin chewable tablet 81 mg, 81 mg, Oral, Daily, 81 mg at 11/16/21 0944 **OR** aspirin chewable tablet 81 mg, 81 mg, Per Tube, Daily, de Sindy Messing, [place], MD ?  bisacodyl (DULCOLAX) EC tablet 5 mg, 5 mg, Oral, Daily PRN, Ashok Pall, MD, 5 mg at 11/15/21 0844 ?  bisacodyl (DULCOLAX) suppository 10 mg, 10 mg, Rectal, Daily PRN, Ashok Pall, MD, 10 mg at 11/16/21 0943 ?  Chlorhexidine Gluconate Cloth 2 % PADS 6 each, 6 each, Topical, Q0600, Ashok Pall, MD, 6 each at 11/16/21 [phone] ?  diazepam (VALIUM) tablet 5 mg, 5 mg, Oral, Q6H PRN, Ashok Pall, MD, 5 mg at 11/16/21 0758 ?  docusate sodium (COLACE) capsule 100 mg, 100 mg, Oral, BID, Ashok Pall, MD, 100 mg at 11/16/21 2221 ?  HYDROmorphone (DILAUDID) injection 0.5 mg, 0.5 mg, Intravenous, Q2H PRN, Ashok Pall, MD, 0.5 mg at 11/16/21 1256 ?  methocarbamol  (ROBAXIN) tablet 500 mg, 500 mg, Oral, Q6H PRN, Ashok Pall, MD, 500 mg at 11/17/21 0731 ?  niMODipine (NIMOTOP) capsule 60 mg, 60 mg, Oral, Q4H, 60 mg at 11/17/21 0606 **OR** niMODipine (NYMALIZE) 6 MG/ML oral solution 60 mg, 60 mg, Per Tube, Q4H, Ashok Pall, MD ?  ondansetron (ZOFRAN-ODT) disintegrating tablet 4 mg, 4 mg, Oral, Q6H PRN **OR** ondansetron (ZOFRAN) injection 4 mg, 4 mg, Intravenous, Q6H PRN, Ashok Pall, MD, 4 mg at 11/13/21 2117 ?  ondansetron (ZOFRAN) injection 4 mg, 4 mg, Intravenous, Q6H PRN, de Sindy Messing, Erven Colla, MD, 4 mg at 11/13/21 0250 ?  oxyCODONE (Oxy IR/ROXICODONE) immediate release tablet 5-10 mg, 5-10 mg, Oral, Q3H PRN, Ashok Pall, MD, 10 mg at 11/16/21 2030 ?  pantoprazole (PROTONIX) EC tablet 40 mg, 40 mg, Oral, Daily, 40 mg at 11/16/21 0944 **OR** pantoprazole sodium (PROTONIX) 40 mg/20 mL oral suspension 40 mg, 40 mg, Per Tube, Daily, Ashok Pall, MD ?  polyethylene glycol (MIRALAX / GLYCOLAX) packet 17 g, 17 g, Oral, Daily, Ashok Pall, MD, 17 g at 11/16/21 0757 ?  prochlorperazine (COMPAZINE) suppository 25 mg, 25 mg, Rectal, Q12H PRN, Ashok Pall, MD ? ? ? ?Vital Signs: ?BP 111/67   Pulse (!) 58   Temp 98.6 ?F (37 ?C) (Oral)   Resp 17   Ht 5\' 6"  (1.676 m)   Wt 93 kg   SpO2 100%   BMI 33.09 kg/m?  ? ?Physical Exam ?NAD, alert ?Neuro: alert, oriented, conversant, speech clear, moving all extremities.  ? ?Imaging: ?VAS [place] TRANSCRANIAL DOPPLER ? ?  Result Date: 11/16/2021 ? Transcranial Doppler Patient Name:  Debbie Armstrong  Date of Exam:   11/16/2021 Medical Rec #: [id number]    Accession #:    [id number] Date of Birth: May 13, 1986    Patient Gender: F Patient Age:   4 years Exam [facility]  [facility] Procedure:      VAS [place] TRANSCRANIAL DOPPLER Referring Phys: Marylyn Ishihara CABBELL --------------------------------------------------------------------------------  Indications: Subarachnoid hemorrhage. Limitations for diagnostic windows: Unable to insonate left  transtemporal window. Comparison Study: 11/13/2021 TCD Performing Technologist: Maudry Mayhew MHA, RDMS, RVT, RDCS  Examination Guidelines: A complete evaluation includes B-mode imaging, spectral Doppler, color Doppler, and power Doppler as needed of all accessible portions of each vessel. Bilateral testing is considered an integral part of a complete examination. Limited examinations for reoccurring indications may be performed as noted.  +----------+-------------+----------+-----------+------------------+ RIGHT TCD Right VM (cm)Depth (cm)Pulsatility     Comment       +----------+-------------+----------+-----------+------------------+ MCA           44.00       4.10      1.14                       +----------+-------------+----------+-----------+------------------+ ACA                                         Unable to insonate +----------+-------------+----------+-----------+------------------+ Term ICA                                    Unable to insonate +----------+-------------+----------+-----------+------------------+ PCA                                         Unable to insonate +----------+-------------+----------+-----------+------------------+ Opthalmic     19.00                 1.50                       +----------+-------------+----------+-----------+------------------+ ICA siphon    25.00                 1.46                       +----------+-------------+----------+-----------+------------------+ Vertebral    -28.00                 1.07                       +----------+-------------+----------+-----------+------------------+ Distal ICA    38.00                                            +----------+-------------+----------+-----------+------------------+  +----------+------------+----------+-----------+------------------+ LEFT TCD  Left VM (cm)Depth (cm)Pulsatility     Comment        +----------+------------+----------+-----------+------------------+ MCA                                        Unable to insonate +----------+------------+----------+-----------+------------------+ ACA  Unable to insonate +----------+------------+----------+-----------+------------------+ Term ICA                                   Unable to insonate +----------+------------+----------+-----------+------------------+ PCA                                        Unable to insonate +----------+------------+----------+-----------+------------------+ Opthalmic    25.00                 1.21                       +----------+------------+----------+-----------+------------------+ ICA siphon   33.00                 1.62                       +----------+------------+----------+-----------+------------------+ Vertebral    -34.00                0.99                       +----------+------------+----------+-----------+------------------+  +------------+------+-------+             VM cm Comment +------------+------+-------+ Prox Basilar-59.00        +------------+------+-------+ Dist Basilar-69.00        +------------+------+-------+ +----------------------+----+ Right Lindegaard Ratio1.16 +----------------------+----+    Preliminary   ? ?VAS [place] TRANSCRANIAL DOPPLER ? ?Result Date: 11/13/2021 ? Transcranial Doppler Patient Name:  Debbie Armstrong  Date of Exam:   11/13/2021 Medical Rec #: [id number]    Accession #:    [id number] Date of Birth: 27-Feb-1986    Patient Gender: F Patient Age:   85 years Exam Location:  [facility] Procedure:      VAS [place] TRANSCRANIAL DOPPLER Referring Phys: Marylyn Ishihara CABBELL --------------------------------------------------------------------------------  Indications: Subarachnoid hemorrhage. Limitations for diagnostic windows: Unable to insonate left transtemporal window. Comparison Study: 11/11/2020 TCD  Performing Technologist: Maudry Mayhew MHA, RDMS, RVT, RDCS  Examination Guidelines: A complete evaluation includes B-mode imaging, spectral Doppler, color Doppler, and power Doppler as needed of all accessible portions of each vessel. Bilateral testing is considered an integral part of a complete examination. Limited examinations for reoccurring indications may be performed as noted.

## 2021-11-17 NOTE — Progress Notes (Signed)
Patient ID: Debbie Armstrong, female   DOB: 09/12/1985, 36 y.o.   MRN: [id number] ?BP 126/85   Pulse 81   Temp 98.6 ?F (37 ?C) (Oral)   Resp 15   Ht 5\' 6"  (1.676 m)   Wt 93 kg   SpO2 95%   BMI 33.09 kg/m?  ?Alert and oriented x 4, speech is clear and fluent ?Moving all extremities ?Doing well ?Continue current regimen.  ?

## 2021-11-18 ENCOUNTER — Inpatient Hospital Stay (HOSPITAL_COMMUNITY): Payer: BC Managed Care – PPO

## 2021-11-18 DIAGNOSIS — I671 Cerebral aneurysm, nonruptured: Secondary | ICD-10-CM

## 2021-11-18 NOTE — Progress Notes (Signed)
Transcranial Doppler ? ?Date POD PCO2 HCT BP  MCA ACA PCA OPHT SIPH VERT Basilar  ?4/10 ?RH     Right  ?Left  ? *  ?*  ? *  ?*  ? *  ?*  ? 26  ?22  ? 27  ?26  ? -34  ?-21  ? -44  ?  ?  ?4/12 ?RH     Right  ?Left  ? *  ?*  ? *  ?*  ? *  ?*  ? 20  ?21  ? 25  ?27  ? -40  ?-27  ? -60  ?  ?  ?4/14 MS     Right  ?Left  ? 32  ?*  ? *  ?*  ? *  ?*  ? 17  ?14  ? 64  ?28  ? -36  ?-15  ? -47  ?  ?  ?4/17 MS     Right  ?Left  ? 44  ?*  ? *  ?*  ? *  ?*  ? 19  ?25  ? 25  ?33  ? -28  ?-34  ? -69  ?  ?  ?4/19 MS     Right  ?Left  ? 56  ?*  ? *  ?*  ? *  ?*  ? 24  ?25  ? 29  ?30  ? -49  ?-32  ? -55  ?  ?  ?     Right  ?Left  ?   ?  ?   ?  ?   ?  ?   ?  ?   ?  ?   ?  ?   ?  ?  ?     Right  ?Left  ?   ?  ?   ?  ?   ?  ?   ?  ?   ?  ?   ?  ?   ?  ?  ? ?MCA = Middle Cerebral Artery      OPHT = Opthalmic Artery     BASILAR = Basilar Artery   ?ACA = Anterior Cerebral Artery     SIPH = Carotid Siphon ?PCA = Posterior Cerebral Artery   VERT = Verterbral Artery                  ? ?Normal ?MCA = 62+\-12 ?ACA = 50+\-12 ?PCA = 42+\-23  ? ?*= Unable to insonate ? ?11/18/21 Right Lindegaard ratio= 1.19, unable to calculate left due to inability to insonate left MCA. ? ?11/18/2021 10:42 AM ?Gertie Fey, MHA, RVT, RDCS, RDMS  ? ? ? ?

## 2021-11-18 NOTE — Progress Notes (Signed)
? ? ?Referring Physician(s): Dr. Franky Machoabbell ? ?Supervising Physician: Baldemar Lenise Macedo Rodrigues, [place] ? ?[facility]  [facility] ? 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 Tommie Samsde Macedo Rodrigues  ? ?Subjective: Patient resting in bed. She denies any pain or discomfort. Her only complaint is intermittent bilateral leg cramping. She states she has been ambulating in the hallway and has pending plans to transfer out of the ICU, hopefully today.  ? ?Allergies: ?Patient has no known allergies. ? ?Medications: ?Prior to Admission medications   ?Medication Sig Start Date End Date Taking? Authorizing Provider  ?Multiple Vitamins-Minerals (HAIR SKIN & NAILS ADVANCED) TABS Take 1 tablet by mouth daily.   Yes [provider]  ? ? ? ?Vital Signs: ?BP 121/72   Pulse 71   Temp 98.1 ?F (36.7 ?C) (Oral)   Resp 14   Ht 5\' 6"  (1.676 m)   Wt 205 lb (93 kg)   SpO2 97%   BMI 33.09 kg/m?  ? ?Physical Exam ?Constitutional:   ?   General: She is not in acute distress. ?Pulmonary:  ?   Effort: Pulmonary effort is normal.  ?Neurological:  ?   Mental Status: She is alert and oriented to person, place, and time.  ?   Cranial Nerves: No dysarthria or facial asymmetry.  ?   Motor: No weakness.  ? ? ?Imaging: ?VAS [place] TRANSCRANIAL DOPPLER ? ?Result Date: 11/18/2021 ? Transcranial Doppler Patient Name:  Debbie BouillonGNES Mondor  Date of Exam:   11/18/2021 Medical Rec #: [id number]    Accession #:    [id number] Date of Birth: 10/17/1985    Patient Gender: F Patient Age:   [age] years [facility] Procedure:      VAS US TRANSCRANIAL DOPPLER Referring Phys: Ronaldo MiyamotoKYLE CABBELL --------------------------------------------------------------------------------  Indications: Subarachnoid hemorrhage. Limitations for diagnostic windows: Unable to insonate left transtemporal window. Comparison Study: 11/16/2021 TCD Performing Technologist: Gertie FeyMichelle Simonetti MHA, RDMS, RVT, RDCS  Examination Guidelines:  A complete evaluation includes B-mode imaging, spectral Doppler, color Doppler, and power Doppler as needed of all accessible portions of each vessel. Bilateral testing is considered an integral part of a complete examination. Limited examinations for reoccurring indications may be performed as noted.  +----------+-------------+----------+-----------+------------------+ RIGHT TCD Right VM (cm)Depth (cm)Pulsatility     Comment       +----------+-------------+----------+-----------+------------------+ MCA           56.00       4.00      1.07                       +----------+-------------+----------+-----------+------------------+ ACA                                         Unable to insonate +----------+-------------+----------+-----------+------------------+ Term ICA                                    Unable to insonate +----------+-------------+----------+-----------+------------------+ PCA                                         Unable to insonate +----------+-------------+----------+-----------+------------------+ Opthalmic     24.00  1.35                       +----------+-------------+----------+-----------+------------------+ ICA siphon    29.00                 1.94                       +----------+-------------+----------+-----------+------------------+ Vertebral    -49.00                 0.76                       +----------+-------------+----------+-----------+------------------+ Distal ICA    47.00                                            +----------+-------------+----------+-----------+------------------+  +----------+------------+----------+-----------+------------------+ LEFT TCD  Left VM (cm)Depth (cm)Pulsatility     Comment       +----------+------------+----------+-----------+------------------+ MCA                                        Unable to insonate  +----------+------------+----------+-----------+------------------+ ACA                                        Unable to insonate +----------+------------+----------+-----------+------------------+ Term ICA                                   Unable to insonate +----------+------------+----------+-----------+------------------+ PCA                                        Unable to insonate +----------+------------+----------+-----------+------------------+ Opthalmic    25.00                 1.61                       +----------+------------+----------+-----------+------------------+ ICA siphon   30.00                 1.60                       +----------+------------+----------+-----------+------------------+ Vertebral    -32.00                0.98                       +----------+------------+----------+-----------+------------------+  +------------+------+-------+             VM cm Comment +------------+------+-------+ Prox Basilar-55.00        +------------+------+-------+ Dist Basilar-51.00        +------------+------+-------+ +----------------------+----+ Right Lindegaard Ratio1.19 +----------------------+----+    Preliminary   ? ?VAS [place] TRANSCRANIAL DOPPLER ? ?Result Date: 11/16/2021 ? Transcranial Doppler Patient Name:  Debbie Armstrong  Date of Exam:   11/16/2021 Medical Rec #: [id number]    Accession #:    [id number] Date of Birth: [birth year]    Patient Gender: F Patient Age:   74 years Exam [facility] Procedure:      VAS [place] TRANSCRANIAL DOPPLER Referring Phys: KYLE CABBELL --------------------------------------------------------------------------------  Indications: Subarachnoid hemorrhage. Limitations for diagnostic windows: Unable to insonate left transtemporal window. Comparison Study: 11/13/2021 TCD Performing Technologist: Gertie Fey MHA, RDMS, RVT, RDCS  Examination Guidelines: A complete evaluation includes B-mode imaging, spectral  Doppler, color Doppler, and power Doppler as needed of all accessible portions of each vessel. Bilateral testing is considered an integral part of a complete examination. Limited examinations for reoccurring indications may be performed as noted.  +----------+-------------+----------+-----------+------------------+ RIGHT TCD Right VM (cm)Depth (cm)Pulsatility     Comment       +----------+-------------+----------+-----------+------------------+ MCA           44.00       4.10      1.14                       +----------+-------------+----------+-----------+------------------+ ACA                                         Unable to insonate +----------+-------------+----------+-----------+------------------+ Term ICA                                    Unable to insonate +----------+-------------+----------+-----------+------------------+ PCA                                         Unable to insonate +----------+-------------+----------+-----------+------------------+ Opthalmic     19.00                 1.50                       +----------+-------------+----------+-----------+------------------+ ICA siphon    25.00                 1.46                       +----------+-------------+----------+-----------+------------------+ Vertebral    -28.00                 1.07                       +----------+-------------+----------+-----------+------------------+ Distal ICA    38.00                                            +----------+-------------+----------+-----------+------------------+  +----------+------------+----------+-----------+------------------+ LEFT TCD  Left VM (cm)Depth (cm)Pulsatility     Comment       +----------+------------+----------+-----------+------------------+ MCA                                        Unable to insonate +----------+------------+----------+-----------+------------------+ ACA                                         Unable to insonate +----------+------------+----------+-----------+------------------+ Term ICA  Unable to insonate +----------+------------+----------+-----------+------------------+ PCA                                        Unable to insonate +----------+------------+----------+-----------+------------------+ Opthalmic    25.00                 1.21                       +----------+------------+----------+----------

## 2021-11-18 NOTE — TOC Benefit Eligibility Note (Signed)
Transition of Care (TOC) Benefit Eligibility Note  ? ? ?Patient Details  ?Name: Tyneisha Hegeman ?MRN: [id number] ?Date of Birth: Sep 05, 1985 ? ? ?Medication/Dose: Nimodipine(Nimotop) 60mg . every 4 hours for 12 day supply ? ?Covered?: Yes ? ?Tier:  (N/A) ? ?Prescription Coverage [facility] ? ?Spoke with Person/Company/Phone Number:: [phone] [employer] [employer] [phone] ? ?Co-Pay: $200.00 ? ?Prior Approval: No ? ?Deductible:  (No Deductible) ? ?Additional Notes: This medication only comes in 30 mg. not 60mg . ? ? ? ?[phone] ?Phone Number: ?11/18/2021, 8:39 AM ? ? ? ? ?

## 2021-11-18 NOTE — Progress Notes (Signed)
Patient ID: Debbie Armstrong, female   DOB: 29-Aug-1985, 36 y.o.   MRN: [id number] ?BP (!) 115/54   Pulse 66   Temp 98.4 ?F (36.9 ?C) (Oral)   Resp 13   Ht 5\' 6"  (1.676 m)   Wt 93 kg   SpO2 100%   BMI 33.09 kg/m?  ?Alert, oriented x 4 ?Follows all commands ?Moving all extremities ?Doing well ?No new recommendations ?

## 2021-11-19 NOTE — Progress Notes (Signed)
Pt arrived to 4NP05 from 4NICU. Pt ambulated to room with her mother and belongings at bedside. Pt is pleasant, alert and orientedx4, and has no questions or concerns at this time. Full assessment documented.  ? ?Robina Ade, RN ? ?

## 2021-11-19 NOTE — Progress Notes (Signed)
? ? ?Referring Physician(s): Dr. Christella Noa ? ?Supervising Physician: Pedro Earls ? ?Patient Status:  [facility] - In-pt ? 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 Karenann Cai  ? ?Subjective: ? ?Patient sitting in chair, NAD.  ?States that she is feeling good today, leg cramping is better.  ?No HA overnight, appetite improved.  ?Expected d/c next Wednesday per neurosurgery, patient is very pleased that she may not need to stay in the hospital for 21 days.  ? ?Allergies: ?Patient has no known allergies. ? ?Medications: ?Prior to Admission medications   ?Medication Sig Start Date End Date Taking? Authorizing Provider  ?Multiple Vitamins-Minerals (HAIR SKIN & NAILS ADVANCED) TABS Take 1 tablet by mouth daily.   Yes [provider]  ? ? ? ?Vital Signs: ?BP 121/72   Pulse 71   Temp 98.1 ?F (36.7 ?C) (Oral)   Resp 14   Ht 5\' 6"  (1.676 m)   Wt 205 lb (93 kg)   SpO2 97%   BMI 33.09 kg/m?  ? ?Physical Exam ?Constitutional:   ?   General: She is not in acute distress. ?   Appearance: She is not ill-appearing.  ?HENT:  ?   Head: Normocephalic.  ?Pulmonary:  ?   Effort: Pulmonary effort is normal.  ?Neurological:  ?   Mental Status: She is alert and oriented to person, place, and time.  ?   Cranial Nerves: No dysarthria or facial asymmetry.  ?   Motor: No weakness.  ?Psychiatric:     ?   Mood and Affect: Mood normal.     ?   Behavior: Behavior normal.  ? ? ?Imaging: ?VAS [place] TRANSCRANIAL DOPPLER ? ?Result Date: 11/18/2021 ? Transcranial Doppler Patient Name:  Debbie Armstrong  Date of Exam:   11/18/2021 Medical Rec #: [id number]    Accession #:    [id number] Date of Birth: 1985/08/11    Patient Gender: F Patient Age:   36 years Exam Location:  [facility] Procedure:      VAS [place] TRANSCRANIAL DOPPLER Referring Phys: Marylyn Ishihara CABBELL --------------------------------------------------------------------------------  Indications: Subarachnoid hemorrhage.  Limitations for diagnostic windows: Unable to insonate left transtemporal window. Comparison Study: 11/16/2021 TCD Performing Technologist: Maudry Mayhew MHA, RDMS, RVT, RDCS  Examination Guidelines: A complete evaluation includes B-mode imaging, spectral Doppler, color Doppler, and power Doppler as needed of all accessible portions of each vessel. Bilateral testing is considered an integral part of a complete examination. Limited examinations for reoccurring indications may be performed as noted.  +----------+-------------+----------+-----------+------------------+ RIGHT TCD Right VM (cm)Depth (cm)Pulsatility     Comment       +----------+-------------+----------+-----------+------------------+ MCA           56.00       4.00      1.07                       +----------+-------------+----------+-----------+------------------+ ACA                                         Unable to insonate +----------+-------------+----------+-----------+------------------+ Term ICA                                    Unable to insonate +----------+-------------+----------+-----------+------------------+ PCA  Unable to insonate +----------+-------------+----------+-----------+------------------+ Opthalmic     24.00                 1.35                       +----------+-------------+----------+-----------+------------------+ ICA siphon    29.00                 1.94                       +----------+-------------+----------+-----------+------------------+ Vertebral    -49.00                 0.76                       +----------+-------------+----------+-----------+------------------+ Distal ICA    47.00                                            +----------+-------------+----------+-----------+------------------+  +----------+------------+----------+-----------+------------------+ LEFT TCD  Left VM (cm)Depth (cm)Pulsatility     Comment        +----------+------------+----------+-----------+------------------+ MCA                                        Unable to insonate +----------+------------+----------+-----------+------------------+ ACA                                        Unable to insonate +----------+------------+----------+-----------+------------------+ Term ICA                                   Unable to insonate +----------+------------+----------+-----------+------------------+ PCA                                        Unable to insonate +----------+------------+----------+-----------+------------------+ Opthalmic    25.00                 1.61                       +----------+------------+----------+-----------+------------------+ ICA siphon   30.00                 1.60                       +----------+------------+----------+-----------+------------------+ Vertebral    -32.00                0.98                       +----------+------------+----------+-----------+------------------+  +------------+------+-------+             VM cm Comment +------------+------+-------+ Prox Basilar-55.00        +------------+------+-------+ Dist Basilar-51.00        +------------+------+-------+ +----------------------+----+ Right Lindegaard Ratio1.19 +----------------------+----+    Preliminary   ? ?VAS [place] TRANSCRANIAL DOPPLER ? ?Result Date: 11/16/2021 ? Transcranial Doppler Patient Name:  Debbie Armstrong  Date of Exam:   11/16/2021 Medical Rec #: [id number]    Accession #:  [id number] Date of Birth: [birth year]    Patient Gender: F Patient Age:   74 years Exam Location:  [facility] Procedure:      VAS [place] TRANSCRANIAL DOPPLER Referring Phys: Marylyn Ishihara CABBELL --------------------------------------------------------------------------------  Indications: Subarachnoid hemorrhage. Limitations for diagnostic windows: Unable to insonate left transtemporal window. Comparison Study: 11/13/2021 TCD  Performing Technologist: Maudry Mayhew MHA, RDMS, RVT, RDCS  Examination Guidelines: A complete evaluation includes B-mode imaging, spectral Doppler, color Doppler, and power Doppler as needed of all accessible portions of each vessel. Bilateral testing is considered an integral part of a complete examination. Limited examinations for reoccurring indications may be performed as noted.  +----------+-------------+----------+-----------+------------------+ RIGHT TCD Right VM (cm)Depth (cm)Pulsatility     Comment       +----------+-------------+----------+-----------+------------------+ MCA           44.00       4.10      1.14                       +----------+-------------+----------+-----------+------------------+ ACA                                         Unable to insonate +----------+-------------+----------+-----------+------------------+ Term ICA                                    Unable to insonate +----------+-------------+----------+-----------+------------------+ PCA                                         Unable to insonate +----------+-------------+----------+-----------+------------------+ Opthalmic     19.00                 1.50                       +----------+-------------+----------+-----------+------------------+ ICA siphon    25.00                 1.46                       +----------+-------------+----------+-----------+------------------+ Vertebral    -28.00                 1.07                       +----------+-------------+----------+-----------+------------------+ Distal ICA    38.00                                            +----------+-------------+----------+-----------+------------------+  +----------+------------+----------+-----------+------------------+ LEFT TCD  Left VM (cm)Depth (cm)Pulsatility     Comment       +----------+------------+----------+-----------+------------------+ MCA                                         Unable to insonate +----------+------------+----------+-----------+------------------+ ACA  Unable to insonate +----------+------------+----------+-----------+------------------+ Term ICA                                   Unable to insonate +----------+------------+----------+-----------+------------------+ PCA

## 2021-11-19 NOTE — Progress Notes (Signed)
Patient ID: Debbie Armstrong, female   DOB: Apr 26, 1986, 36 y.o.   MRN: [id number] ?BP 104/67 (BP Location: Left Arm)   Pulse (!) 59   Temp 98.4 ?F (36.9 ?C) (Oral)   Resp 16   Ht 5\' 6"  (1.676 m)   Wt 93 kg   SpO2 99%   BMI 33.09 kg/m?  ?Alert and oriented x 4, speech is clear and fluent, moving all extremities ?Perrl, full eom ?Doing very well ?Day 14 is Saturday, unlikely I will keep her for 21 days. Looking at Wednesday next week for discharge.  ?

## 2021-11-20 ENCOUNTER — Inpatient Hospital Stay (HOSPITAL_COMMUNITY): Payer: BC Managed Care – PPO

## 2021-11-20 DIAGNOSIS — I671 Cerebral aneurysm, nonruptured: Secondary | ICD-10-CM

## 2021-11-20 NOTE — Progress Notes (Signed)
? ? ?Referring Physician(s): ?Dr. Coletta Memos ? ?Supervising Physician: Baldemar Lenis ? ?Patient Status:  [facility] - In-pt ? ?Chief Complaint: ?Subarachnoid hemorrhage ? ?Subjective: ?Assessed alongside Dr. Tommie Sams. ?Lying in bed.  ?Complains of leg cramps; has pain medication.  ?Family at bedside.  ? ?Allergies: ?Patient has no known allergies. ? ?Medications: ?Prior to Admission medications   ?Medication Sig Start Date End Date Taking? Authorizing Provider  ?Multiple Vitamins-Minerals (HAIR SKIN & NAILS ADVANCED) TABS Take 1 tablet by mouth daily.   Yes [provider]  ? ? ? ?Vital Signs: ?BP (!) 121/93 (BP Location: Right Arm)   Pulse 61   Temp 98 ?F (36.7 ?C) (Oral)   Resp 14   Ht 5\' 6"  (1.676 m)   Wt 205 lb (93 kg)   SpO2 100%   BMI 33.09 kg/m?  ? ?Physical Exam ?NAD, alert ?Neuro: alert, oriented, conversant, speech clear, moving all extremities.  ? ?Imaging: ?VAS TRANSCRANIAL DOPPLER ? ?Result Date: 11/19/2021 ? Transcranial Doppler Patient Name:  QUINCEE GITTENS  Date of Exam:   11/16/2021 Medical Rec #: [id number]/2021    Accession #:    [id number] Date of Birth: [birth year]    Patient Gender: F Patient Age:   57 years Exam Location:  [facility] Procedure:      VAS [facility] TRANSCRANIAL DOPPLER Referring Phys: [place] CABBELL --------------------------------------------------------------------------------  Indications: Subarachnoid hemorrhage. Limitations for diagnostic windows: Unable to insonate left transtemporal window. Comparison Study: 11/13/2021 TCD Performing Technologist: 11/15/2021 MHA, RDMS, RVT, RDCS  Examination Guidelines: A complete evaluation includes B-mode imaging, spectral Doppler, color Doppler, and power Doppler as needed of all accessible portions of each vessel. Bilateral testing is considered an integral part of a complete examination. Limited examinations for reoccurring indications may be performed as noted.   +----------+-------------+----------+-----------+------------------+ RIGHT TCD Right VM (cm)Depth (cm)Pulsatility     Comment       +----------+-------------+----------+-----------+------------------+ MCA           44.00       4.10      1.14                       +----------+-------------+----------+-----------+------------------+ ACA                                         Unable to insonate +----------+-------------+----------+-----------+------------------+ Term ICA                                    Unable to insonate +----------+-------------+----------+-----------+------------------+ PCA                                         Unable to insonate +----------+-------------+----------+-----------+------------------+ Opthalmic     19.00                 1.50                       +----------+-------------+----------+-----------+------------------+ ICA siphon    25.00                 1.46                       +----------+-------------+----------+-----------+------------------+  Vertebral    -28.00                 1.07                       +----------+-------------+----------+-----------+------------------+ Distal ICA    38.00                                            +----------+-------------+----------+-----------+------------------+  +----------+------------+----------+-----------+------------------+ LEFT TCD  Left VM (cm)Depth (cm)Pulsatility     Comment       +----------+------------+----------+-----------+------------------+ MCA                                        Unable to insonate +----------+------------+----------+-----------+------------------+ ACA                                        Unable to insonate +----------+------------+----------+-----------+------------------+ Term ICA                                   Unable to insonate +----------+------------+----------+-----------+------------------+ PCA                                         Unable to insonate +----------+------------+----------+-----------+------------------+ Opthalmic    25.00                 1.21                       +----------+------------+----------+-----------+------------------+ ICA siphon   33.00                 1.62                       +----------+------------+----------+-----------+------------------+ Vertebral    -34.00                0.99                       +----------+------------+----------+-----------+------------------+  +------------+------+-------+             VM cm Comment +------------+------+-------+ Prox Basilar-59.00        +------------+------+-------+ Dist Basilar-69.00        +------------+------+-------+ +----------------------+----+ Right Lindegaard Ratio1.16 +----------------------+----+  Summary:  Absent right temporal window and poor right temporal window limits evaluation. Normal mean flow velocities in identified vessels without evidence of vasospasm noted. *See table(s) above for TCD measurements and observations.  Diagnosing physician: Delia HeadyPramod Sethi MD Electronically signed by Delia HeadyPramod Sethi MD on 11/19/2021 at 8:49:04 AM.    Final   ? ?VAS [place] TRANSCRANIAL DOPPLER ? ?Result Date: 11/19/2021 ? Transcranial Doppler Patient Name:  Gwenyth BouillonGNES Bossi  Date of Exam:   11/18/2021 Medical Rec #: [id number]    Accession #:    [id number] Date of Birth: 12/08/1985    Patient Gender: F Patient Age:   [age] years Exam [facility] Procedure:      VAS [place] TRANSCRANIAL DOPPLER Referring Phys: Ronaldo MiyamotoKYLE CABBELL --------------------------------------------------------------------------------  Indications: Subarachnoid  hemorrhage. Limitations for diagnostic windows: Unable to insonate left transtemporal window. Comparison Study: 11/16/2021 TCD Performing Technologist: Gertie Fey MHA, RDMS, RVT, RDCS  Examination Guidelines: A complete evaluation includes B-mode imaging, spectral Doppler, color Doppler, and  power Doppler as needed of all accessible portions of each vessel. Bilateral testing is considered an integral part of a complete examination. Limited examinations for reoccurring indications may be performed as noted.  +----------+-------------+----------+-----------+------------------+ RIGHT TCD Right VM (cm)Depth (cm)Pulsatility     Comment       +----------+-------------+----------+-----------+------------------+ MCA           56.00       4.00      1.07                       +----------+-------------+----------+-----------+------------------+ ACA                                         Unable to insonate +----------+-------------+----------+-----------+------------------+ Term ICA                                    Unable to insonate +----------+-------------+----------+-----------+------------------+ PCA                                         Unable to insonate +----------+-------------+----------+-----------+------------------+ Opthalmic     24.00                 1.35                       +----------+-------------+----------+-----------+------------------+ ICA siphon    29.00                 1.94                       +----------+-------------+----------+-----------+------------------+ Vertebral    -49.00                 0.76                       +----------+-------------+----------+-----------+------------------+ Distal ICA    47.00                                            +----------+-------------+----------+-----------+------------------+  +----------+------------+----------+-----------+------------------+ LEFT TCD  Left VM (cm)Depth (cm)Pulsatility     Comment       +----------+------------+----------+-----------+------------------+ MCA                                        Unable to insonate +----------+------------+----------+-----------+------------------+ ACA                                        Unable to insonate  +----------+------------+----------+-----------+------------------+ Term ICA  Unable to insonate +----------+------------+----------+-----------+------------------+ PCA                                        Unable to insonate +----------+------------+----------+-----------+------------------+ Opthalmic    25.00                 1.61                       +----------+------------+----------+-----------+

## 2021-11-20 NOTE — Progress Notes (Signed)
Patient ID: Debbie Armstrong, female   DOB: [birth year], 36 y.o.   MRN: [id number] ?BP 135/81 (BP Location: Right Arm)   Pulse 74   Temp 98.8 ?F (37.1 ?C) (Oral)   Resp 18   Ht 5\' 6"  (1.676 m)   Wt 93 kg   SpO2 100%   BMI 33.09 kg/m?  ?Alert and oriented x 4, speech is clear and fluent ?Moving all extremities well ?Probable discharge next week ?stable ?

## 2021-11-20 NOTE — Progress Notes (Signed)
Transcranial Doppler ? ?Date POD PCO2 HCT BP  MCA ACA PCA OPHT SIPH VERT Basilar  ?4/10 ?RH     Right  ?Left  ? *  ?*  ? *  ?*  ? *  ?*  ? 26  ?22  ? 27  ?26  ? -34  ?-21  ? -44  ?  ?  ?4/12 ?RH     Right  ?Left  ? *  ?*  ? *  ?*  ? *  ?*  ? 20  ?21  ? 25  ?27  ? -40  ?-27  ? -60  ?  ?  ?4/14 MS     Right  ?Left  ? 32  ?*  ? *  ?*  ? *  ?*  ? 17  ?14  ? 64  ?28  ? -36  ?-15  ? -47  ?  ?  ?4/17 MS     Right  ?Left  ? 44  ?*  ? *  ?*  ? *  ?*  ? 19  ?25  ? 25  ?33  ? -28  ?-34  ? -69  ?  ?  ?4/19 MS     Right  ?Left  ? 56  ?*  ? *  ?*  ? *  ?*  ? 24  ?25  ? 29  ?30  ? -49  ?-32  ? -55  ?  ?  ?4/21 RS     Right  ?Left  ? 162  ?*  ? -118  ?96  ? 43  ?91  ? 49  ?40  ? *  ?*  ? -39  ?-33  ? -59  ?  ?  ?     Right  ?Left  ?   ?  ?   ?  ?   ?  ?   ?  ?   ?  ?   ?  ?   ?  ?  ? ?MCA = Middle Cerebral Artery      OPHT = Opthalmic Artery     BASILAR = Basilar Artery   ?ACA = Anterior Cerebral Artery     SIPH = Carotid Siphon ?PCA = Posterior Cerebral Artery   VERT = Verterbral Artery                  ? ?Normal ?MCA = 62+\-12 ?ACA = 50+\-12 ?PCA = 42+\-23  ? ?*= Unable to insonate ? ?11/20/21 Right Lindegaard ratio= 5.0, unable to calculate left due to inability to insonate left MCA. ? ?11/20/2021 5:00 PM ?Gertie Fey, MHA, RVT, RDCS, RDMS on behalf of Marilynne Halsted, RVT, RDMS ? ? ? ?

## 2021-11-20 NOTE — Progress Notes (Signed)
TCD study attempted. Patient not in room. Will attempt again as schedule and patient availability permits.  ? ?Jean Rosenthal, RDMS, RVT ? ?

## 2021-11-21 NOTE — Progress Notes (Signed)
Neurosurgery Service ?Progress Note ? ?Subjective: No acute events overnight, no new complaints this morning, she feels great this morning - headaches are finally resolved ? ?Objective: ?Vitals:  ? 11/20/21 2028 11/20/21 2349 11/21/21 0256 11/21/21 0740  ?BP: 128/76 124/69 (!) 144/80 110/79  ?Pulse:  (!) 55 66 64  ?Resp: 16 18 18 16   ?Temp: 98.6 ?F (37 ?C) 98.5 ?F (36.9 ?C) 98.2 ?F (36.8 ?C) 98.6 ?F (37 ?C)  ?TempSrc: Oral Oral Oral Oral  ?SpO2: 99% 99% 100% 100%  ?Weight:      ?Height:      ? ? ?Physical Exam: ?Aox3 gaze conjugate FS speech fluent Fcx4 full strength ? ?Assessment & Plan: ?36 y.o. woman w/ ACA aneurysm coiling x3, recovering well. ? ?-TCDs from yesterday afternoon with an increase in MCA velocities, she's currently post-bleed day 13, velocity still <200, will follow clinically and repeat another set prior to discharge ? ?31 A Aarionna Germer  ?11/21/21 ?8:02 AM ? ?

## 2021-11-22 NOTE — Progress Notes (Signed)
Patient doing well.  No headache.  No new neurologic symptoms or problems. ? ?She is afebrile.  Her vital signs are stable.  She is awake and alert.  She is oriented and appropriate.  Motor and sensory function intact. ? ?Doing well following aneurysm coiling.  Continue supportive care and monitoring.  Home middle of the week. ?

## 2021-11-23 ENCOUNTER — Inpatient Hospital Stay (HOSPITAL_COMMUNITY): Payer: BC Managed Care – PPO

## 2021-11-23 DIAGNOSIS — I671 Cerebral aneurysm, nonruptured: Secondary | ICD-10-CM

## 2021-11-23 NOTE — Progress Notes (Addendum)
? ? ?Referring Physician(s): ?Dr. Lesli Armstrong ? ?Supervising Physician: Debbie Armstrong ? ?Patient Status:  [name] - In-pt ? ?Chief Complaint: ? 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 Raliegh Ip. de Sindy Messing ? ? ?Subjective: ? ?Patient laying in bed calm and comfortable. States that she is feeling better. Denies any headaches or leg craps and states her appetitive is improving. Anticipated discharge Wednesday at the latest.  ? ?Allergies: ?Patient has no known allergies. ? ?Medications: ?Prior to Admission medications   ?Medication Sig Start Date End Date Taking? Authorizing Provider  ?Multiple Vitamins-Minerals (HAIR SKIN & NAILS ADVANCED) TABS Take 1 tablet by mouth daily.   Yes [provider]  ? ? ? ?Vital Signs: ?BP 132/90   Pulse 61   Temp 98.5 ?F (36.9 ?C) (Tympanic)   Resp 19   Ht 5\' 6"  (1.676 m)   Wt 205 lb (93 kg)   SpO2 100%   BMI 33.09 kg/m?  ? ?Physical Exam ?Vitals and nursing note reviewed.  ?Constitutional:   ?   Appearance: She is well-developed.  ?HENT:  ?   Head: Normocephalic and atraumatic.  ?Eyes:  ?   Conjunctiva/sclera: Conjunctivae normal.  ?Cardiovascular:  ?   Rate and Rhythm: Normal rate and regular rhythm.  ?Pulmonary:  ?   Effort: Pulmonary effort is normal.  ?Musculoskeletal:  ?   Cervical back: Normal range of motion.  ?Neurological:  ?   Mental Status: She is alert and oriented to person, place, and time.  ? ? ?Imaging: ?VAS [place] TRANSCRANIAL DOPPLER ? ?Result Date: 11/21/2021 ? Transcranial Doppler Patient Name:  Debbie Armstrong  Date of Exam:   11/20/2021 Medical Rec #: [id number]    Accession #:    [id number] Date of Birth: 27-Jul-1986    Patient Gender: F Patient Age:   57 years Exam Location:  [facility] Procedure:      VAS [place] TRANSCRANIAL DOPPLER Referring Phys: Debbie Armstrong --------------------------------------------------------------------------------  Indications: Subarachnoid  hemorrhage. 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 Performing Technologist: Debbie Armstrong RDMS, RVT  Examination Guidelines: A complete evaluation includes B-mode imaging, spectral Doppler, color Doppler, and power Doppler as needed of all accessible portions of each vessel. Bilateral testing is considered an integral part of a complete examination. Limited examinations for reoccurring indications may be performed as noted.  +----------+-------------+----------+-----------+-------+ RIGHT TCD Right VM (cm)Depth (cm)PulsatilityComment +----------+-------------+----------+-----------+-------+ MCA          162.00      59.00      0.67            +----------+-------------+----------+-----------+-------+ ACA          -118.00     62.00      0.87            +----------+-------------+----------+-----------+-------+ Term ICA      96.00                 0.89            +----------+-------------+----------+-----------+-------+ PCA           43.00                 0.95            +----------+-------------+----------+-----------+-------+ Opthalmic     49.00                                 +----------+-------------+----------+-----------+-------+  Vertebral    -39.00                 0.71            +----------+-------------+----------+-----------+-------+ Distal ICA    32.00                                 +----------+-------------+----------+-----------+-------+  +---------+------------+----------+-----------+-------+ LEFT TCD Left VM (cm)Depth (cm)PulsatilityComment +---------+------------+----------+-----------+-------+ PCA         91.00                 0.59            +---------+------------+----------+-----------+-------+ Opthalmic   49.00                                 +---------+------------+----------+-----------+-------+ Vertebral   -33.00                 59              +---------+------------+----------+-----------+-------+  +------------+------+-------+             VM cm Comment +------------+------+-------+ Prox Basilar-50.00        +------------+------+-------+ Dist Basilar-59.00        +------------+------+-------+ +----------------------+---+ Right Lindegaard Ratio5.0 +----------------------+---+  Summary:  Elevated mean flow velocities suggestive of moderate right middle crebral , mild right anterior cerebral, terminal right internal carotid, left posterior cerebral artery vasospasm.Normal mean flow velocities in remaining identified vessels of anterior and posterior cerebral circulation. *See table(s) above for TCD measurements and observations.  Diagnosing physician: Debbie Contras MD Electronically signed by Debbie Contras MD on 11/21/2021 at 12:00:31 PM.    Final    ? ?Labs: ? ?CBC: ?Recent Labs  ?  11/08/21 ?1334 11/08/21 ?1720 11/17/21 ?[id number]  ?WBC 15.7* 18.1* 7.8  ?HGB 13.6 13.8 13.2  ?HCT 41.5 42.9 40.1  ?PLT 245 264 257  ? ? ?COAGS: ?Recent Labs  ?  11/08/21 ?1626  ?INR 1.1  ?APTT 24  ? ? ?BMP: ?Recent Labs  ?  11/08/21 ?1334 11/17/21 ?[id number]  ?NA 138 133*  ?K 3.2* 3.6  ?CL 110 103  ?CO2 20* 21*  ?GLUCOSE 115* 95  ?BUN 11 8  ?CALCIUM 8.5* 8.7*  ?CREATININE 0.68 0.74  ?GFRNONAA >60 >60  ? ? ? ?Assessment and Plan: ? ?36 y.o. female inpatient. History of. Presented to the ED at [facility] on 4.9.23 with sudden onset of headache and neck pain found to have a subarachnoid hemorrhage in the basal cisterns with small hematoma at the level of the anterior interhemispheric fissure. Intervention performed by Dr. Dr Debbie Armstrong   Successful and uncomplicated endovascular coil embolization of 3 ?small anterior communicating artery aneurysms.2. 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 However, bleeding pattern is small consistent with anterior ?communicating artery aneurysm rupture. 3. 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 Blood supply to the left MCA and ACA vascular tree is derived from right anterior circulation via prominent anterior communicating artery. ? ?Patient seen at bedside with Dr. Karenann Cai. 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 Patient otherwise denies any issues at this time. Headaches and leg cramps have resolved. Patient ia ambulate to ambulate the halls and states that her appetite is improving.  ? ?Plan for cerebral angiogram in 3 months time. IR  scheduler will call with appoinment date and time. Please call [phone] with any questions or concerns ? ?IR will continue to follow along - plans per neurosurgery. ? ?Electronically Signed: ?Jacqualine Mau, NP ?11/23/2021, 10:59 AM ? ? ?I spent a total of 15 Minutes at the the patient's bedside AND on the patient's hospital floor or unit, greater than 50% of which was counseling/coordinating care for subarachnoid hemorrhage with embolization of 3 anterior communicated artery aneurysms.  ? ? ? ? ? ?

## 2021-11-23 NOTE — Progress Notes (Signed)
Transcranial Doppler ? ?Date POD PCO2 HCT BP  MCA ACA PCA OPHT SIPH VERT Basilar  ?4/10 ?RH     Right  ?Left  ? *  ?*  ? *  ?*  ? *  ?*  ? 26  ?22  ? 27  ?26  ? -34  ?-21  ? -44  ?  ?  ?4/12 ?RH     Right  ?Left  ? *  ?*  ? *  ?*  ? *  ?*  ? 20  ?21  ? 25  ?27  ? -40  ?-27  ? -60  ?  ?  ?4/14 MS     Right  ?Left  ? 32  ?*  ? *  ?*  ? *  ?*  ? 17  ?14  ? 64  ?28  ? -36  ?-15  ? -47  ?  ?  ?4/17 MS     Right  ?Left  ? 44  ?*  ? *  ?*  ? *  ?*  ? 19  ?25  ? 25  ?33  ? -28  ?-34  ? -69  ?  ?  ?4/19 MS     Right  ?Left  ? 56  ?*  ? *  ?*  ? *  ?*  ? 24  ?25  ? 29  ?30  ? -49  ?-32  ? -55  ?  ?  ?4/21 RS     Right  ?Left  ? 162  ?*  ? -118  ?96  ? 43  ?91  ? 49  ?40  ? *  ?*  ? -39  ?-33  ? -59  ?  ?  ?4/24 ?RH     Right  ?Left  ? *  ?*  ? *  ?*  ? *  ?*  ? 16  ?14  ? 35  ?32  ? -34  ?-29  ? -56  ?  ?  ? ?MCA = Middle Cerebral Artery      OPHT = Opthalmic Artery     BASILAR = Basilar Artery   ?ACA = Anterior Cerebral Artery     SIPH = Carotid Siphon ?PCA = Posterior Cerebral Artery   VERT = Verterbral Artery                  ? ?Normal ?MCA = 62+\-12 ?ACA = 50+\-12 ?PCA = 42+\-23  ? ?*= Unable to insonate ? ?Darlin Coco, RDMS, RVT ? ? ? ?

## 2021-11-23 NOTE — Plan of Care (Signed)
?  Problem: Education: ?Goal: Knowledge of General Education information will improve ?Description: Including pain rating scale, medication(s)/side effects and non-pharmacologic comfort measures ?Outcome: Progressing ?  ?Problem: Health Behavior/Discharge Planning: ?Goal: Ability to manage health-related needs will improve ?Outcome: Progressing ?  ?Problem: Clinical Measurements: ?Goal: Ability to maintain clinical measurements within normal limits will improve ?Outcome: Progressing ?Goal: Will remain free from infection ?Outcome: Progressing ?Goal: Diagnostic test results will improve ?Outcome: Progressing ?Goal: Respiratory complications will improve ?Outcome: Progressing ?Goal: Cardiovascular complication will be avoided ?Outcome: Progressing ?  ?Problem: Activity: ?Goal: Risk for activity intolerance will decrease ?Outcome: Progressing ?  ?Problem: Nutrition: ?Goal: Adequate nutrition will be maintained ?Outcome: Progressing ?  ?Problem: Coping: ?Goal: Level of anxiety will decrease ?Outcome: Progressing ?  ?Problem: Elimination: ?Goal: Will not experience complications related to bowel motility ?Outcome: Progressing ?Goal: Will not experience complications related to urinary retention ?Outcome: Progressing ?  ?Problem: Pain Managment: ?Goal: General experience of comfort will improve ?Outcome: Progressing ?  ?Problem: Safety: ?Goal: Ability to remain free from injury will improve ?Outcome: Progressing ?  ?Problem: Skin Integrity: ?Goal: Risk for impaired skin integrity will decrease ?Outcome: Progressing ?  ?Problem: Education: ?Goal: Knowledge of disease or condition will improve ?Outcome: Progressing ?Goal: Knowledge of secondary prevention will improve (SELECT ALL) ?Outcome: Progressing ?Goal: Knowledge of patient specific risk factors will improve (INDIVIDUALIZE FOR PATIENT) ?Outcome: Progressing ?  ?Problem: Coping: ?Goal: Will verbalize positive feelings about self ?Outcome: Progressing ?Goal: Will  identify appropriate support needs ?Outcome: Progressing ?  ?Problem: Health Behavior/Discharge Planning: ?Goal: Ability to manage health-related needs will improve ?Outcome: Progressing ?  ?Problem: Self-Care: ?Goal: Ability to participate in self-care as condition permits will improve ?Outcome: Progressing ?Goal: Verbalization of feelings and concerns over difficulty with self-care will improve ?Outcome: Progressing ?Goal: Ability to communicate needs accurately will improve ?Outcome: Progressing ?  ?Problem: Nutrition: ?Goal: Risk of aspiration will decrease ?Outcome: Progressing ?Goal: Dietary intake will improve ?Outcome: Progressing ?  ?Problem: Spontaneous Subarachnoid Hemorrhage Tissue Perfusion: ?Goal: Complications of Spontaneous Subarachnoid Hemorrhage will be minimized ?Outcome: Progressing ?  ?

## 2021-11-23 NOTE — Progress Notes (Signed)
? ?  Providing Compassionate, Quality Care - Together ? ? ?Subjective: ?Patient reports she feels much better. She is eating and drinking better. Headaches improved. TCD in process during this assessment. ? ?Objective: ?Vital signs in last 24 hours: ?Temp:  [98.5 ?F (36.9 ?C)-98.6 ?F (37 ?C)] 98.5 ?F (36.9 ?C) (04/24 0700) ?Pulse Rate:  [61-73] 61 (04/23 1933) ?Resp:  [15-21] 19 (04/24 6834) ?BP: (113-136)/(68-90) 132/90 (04/24 0700) ?SpO2:  [100 %] 100 % (04/24 0700) ? ?Intake/Output from previous day: ?No intake/output data recorded. ?Intake/Output this shift: ?No intake/output data recorded. ? ?Alert and oriented x 4 ?PERRLA ?CN II-XII grossly intact ?MAE, Strength and sensation intact ? ? ? ?Lab Results: ?No results for input(s): WBC, HGB, HCT, PLT in the last 72 hours. ?BMET ?No results for input(s): NA, K, CL, CO2, GLUCOSE, BUN, CREATININE, CALCIUM in the last 72 hours. ? ?Studies/Results: ?No results found. ? ?Assessment/Plan: ?Ms. Dwight suffered an aneurysmal SAH on 11/08/2021. She underwent coiling of three ACA aneurysms by Dr.de Melchor Amour on 11/08/2021.  ? ? LOS: 15 days  ? ?-Likely home tomorrow pending patient's clinical presentation and TCD studies. ? ? ?Val Eagle, DNP, AGNP-C ?Nurse Practitioner ? ?[facility] ?[address], [address], [place], [place] [address] ?P: [phone]    F: [phone] ? ?11/23/2021, 2:38 PM ? ? ? ? ?

## 2021-11-24 ENCOUNTER — Other Ambulatory Visit (HOSPITAL_COMMUNITY): Payer: Self-pay

## 2021-11-24 LAB — GLUCOSE, CAPILLARY: Glucose-Capillary: 78 mg/dL (ref 70–99)

## 2021-11-24 NOTE — TOC Benefit Eligibility Note (Signed)
Patient Advocate Encounter ? ?Prior Authorization for niMODipine 30MG  capsules has been approved.   ? ?PA# ?Effective dates: 11/24/2021 through 11/25/2022 ? ?Patients co-pay is $4.00.  ? ? ? ?11/27/2022, CPhT ?Pharmacy Patient Advocate Specialist ?[facility] Patient Advocate Team ?Direct Number: [phone]  Fax: [phone]  ?

## 2021-11-24 NOTE — TOC Progression Note (Addendum)
Transition of Care (TOC) - Progression Note  ? ? ?Patient Details  ?Name: Debbie Armstrong ?MRN: [id number] ?Date of Birth: [birth year] ? ?Transition of Care (TOC) CM/SW Contact  ?Angelita Ingles, RN ?Phone Number:[phone] ? ?11/24/2021, 11:41 AM ? ?Clinical Narrative:    ?CM at bedside to verify that patient will be able to receive her medications. Patient states that she uses her medicaid to fill scripts. CM has requested benefits check to make sure patient will be able to use her medicaid for  script refill upon d/c .  ? ?[facility] completed benefits check to determine that patient pre authorization is needed for medication to be covered under medicaid. Lyndel Safe is currently working on pre auth and will update CM. ? ? ?  ?  ? ?Expected Discharge Plan and Services ?  ?  ?  ?  ?  ?                ?  ?  ?  ?  ?  ?  ?  ?  ?  ?  ? ? ?Social Determinants of Health (SDOH) Interventions ?  ? ?Readmission Risk Interventions ?   ? View : No data to display.  ?  ?  ?  ? ? ?

## 2021-11-24 NOTE — TOC Benefit Eligibility Note (Signed)
Patient Advocate Encounter ? ?Insurance verification completed.   ? ?The patient is currently admitted and upon discharge could be taking Nimotop 30 mg. ? ?The current 30 day co-pay is, $200.  ? ?The patient is insured through [employer] (primary)  ? ?Requires prior authorization  ? ?The patient is insured through [place] [employer] [place] [employer] ? ?Roland Earl, CPhT ?Pharmacy Patient Advocate Specialist ?[facility] Patient Advocate Team ?Direct Number: [phone]  Fax: [phone] ? ? ? ? ? ?  ?

## 2021-11-24 NOTE — TOC Benefit Eligibility Note (Signed)
Patient Advocate Encounter ?  ?Received notification that prior authorization for niMODipine 30MG  capsules is required. ?  ?PA submitted on 11/24/2021 ?Key [id number]  ?Status is pending ?   ? ? ? ?11/26/2021, CPhT ?Pharmacy Patient Advocate Specialist ?[facility] Patient Advocate Team ?Direct Number: [phone]  Fax: [phone]  ?

## 2021-11-24 NOTE — Progress Notes (Signed)
? ? ?Referring Physician(s): ?Dr. Lesli Albee ? ?Supervising Physician: Pedro Earls ? ?Patient Status:  [name] - In-pt ? ?Chief Complaint: ? 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 Raliegh Ip. de Sindy Messing ? ? ?Subjective: ? ?Patient laying in bed calm and comfortable. She is excited to go home.  ?Reports mild lightheadedness this morning, patient think it is because she has not had breakfast yet.  ? ? ?Allergies: ?Patient has no known allergies. ? ?Medications: ?Prior to Admission medications   ?Medication Sig Start Date End Date Taking? Authorizing Provider  ?Multiple Vitamins-Minerals (HAIR SKIN & NAILS ADVANCED) TABS Take 1 tablet by mouth daily.   Yes [provider]  ? ? ? ?Vital Signs: ?BP 119/72 (BP Location: Right Arm)   Pulse 65   Temp 98.4 ?F (36.9 ?C)   Resp 18   Ht 5\' 6"  (1.676 m)   Wt 205 lb (93 kg)   SpO2 99%   BMI 33.09 kg/m?  ? ?Physical Exam ?Vitals and nursing note reviewed.  ?Constitutional:   ?   General: She is not in acute distress. ?   Appearance: She is well-developed. She is not ill-appearing.  ?HENT:  ?   Head: Normocephalic and atraumatic.  ?Cardiovascular:  ?   Rate and Rhythm: Normal rate and regular rhythm.  ?Pulmonary:  ?   Effort: Pulmonary effort is normal.  ?Abdominal:  ?   General: Abdomen is flat.  ?Musculoskeletal:  ?   Cervical back: Normal range of motion.  ?Skin: ?   General: Skin is warm and dry.  ?Neurological:  ?   Mental Status: She is alert and oriented to person, place, and time.  ?Psychiatric:     ?   Mood and Affect: Mood normal.     ?   Behavior: Behavior normal.  ? ? ?Imaging: ?VAS [place] TRANSCRANIAL DOPPLER ? ?Result Date: 11/23/2021 ? Transcranial Doppler Patient Name:  Debbie Armstrong  Date of Exam:   11/23/2021 Medical Rec #: [id number]    Accession #:    [id number] Date of Birth: Dec 07, 1985    Patient Gender: F Patient Age:   36 years Exam Location:  [facility]  Procedure:      VAS [place] TRANSCRANIAL DOPPLER Referring Phys: Marylyn Ishihara CABBELL --------------------------------------------------------------------------------  Indications: Subarachnoid hemorrhage. Limitations for diagnostic windows: Unable to insonate right transtemporal window. Unable to insonate left transtemporal window. Comparison Study: Most recent prior study on 11-20-21 Performing Technologist: Darlin Coco RDMS, RVT  Examination Guidelines: A complete evaluation includes B-mode imaging, spectral Doppler, color Doppler, and power Doppler as needed of all accessible portions of each vessel. Bilateral testing is considered an integral part of a complete examination. Limited examinations for reoccurring indications may be performed as noted.  +----------+-------------+----------+-----------+------------------+ RIGHT TCD Right VM (cm)Depth (cm)Pulsatility     Comment       +----------+-------------+----------+-----------+------------------+ MCA                                         Unable to insonate +----------+-------------+----------+-----------+------------------+ ACA                                         Unable to insonate +----------+-------------+----------+-----------+------------------+ Term ICA  Unable to insonate +----------+-------------+----------+-----------+------------------+ PCA                                         Unable to insonate +----------+-------------+----------+-----------+------------------+ Opthalmic     16.00                 1.25                       +----------+-------------+----------+-----------+------------------+ ICA siphon    35.00                 1.69                       +----------+-------------+----------+-----------+------------------+ Vertebral    -34.00                 0.68                       +----------+-------------+----------+-----------+------------------+   +----------+------------+----------+-----------+------------------+ LEFT TCD  Left VM (cm)Depth (cm)Pulsatility     Comment       +----------+------------+----------+-----------+------------------+ MCA                                        Unable to insonate +----------+------------+----------+-----------+------------------+ ACA                                        Unable to insonate +----------+------------+----------+-----------+------------------+ Term ICA                                   Unable to insonate +----------+------------+----------+-----------+------------------+ PCA                                        Unable to insonate +----------+------------+----------+-----------+------------------+ Opthalmic    14.00                 0.84                       +----------+------------+----------+-----------+------------------+ ICA siphon   32.00                 2.07                       +----------+------------+----------+-----------+------------------+ Vertebral    -29.00                1.06                       +----------+------------+----------+-----------+------------------+  +------------+------+-------+             VM cm Comment +------------+------+-------+ Prox Basilar-43.00        +------------+------+-------+ Dist Basilar-56.00        +------------+------+-------+    Preliminary   ? ?VAS [place] TRANSCRANIAL DOPPLER ? ?Result Date: 11/21/2021 ? Transcranial Doppler Patient Name:  Debbie Armstrong  Date of Exam:   11/20/2021 Medical Rec #: [id number]    Accession #:    [id number] Date of Birth: 09/10/1985  Patient Gender: F Patient Age:   36 years Exam Location:  [facility] Procedure:      VAS [place] TRANSCRANIAL DOPPLER Referring Phys: KYLE CABBELL --------------------------------------------------------------------------------  Indications: Subarachnoid hemorrhage. 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  Performing Technologist: Oda Cogan RDMS, RVT  Examination Guidelines: A complete evaluation includes B-mode imaging, spectral Doppler, color Doppler, and power Doppler as needed of all accessible portions of each vessel. Bilateral testing is considered an integral part of a complete examination. Limited examinations for reoccurring indications may be performed as noted.  +----------+-------------+----------+-----------+-------+ RIGHT TCD Right VM (cm)Depth (cm)PulsatilityComment +----------+-------------+----------+-----------+-------+ MCA          162.00      59.00      0.67            +----------+-------------+----------+-----------+-------+ ACA          -118.00     62.00      0.87            +----------+-------------+----------+-----------+-------+ Term ICA      96.00                 0.89            +----------+-------------+----------+-----------+-------+ PCA           43.00                 0.95            +----------+-------------+----------+-----------+-------+ Opthalmic     49.00                                 +----------+-------------+----------+-----------+-------+ Vertebral    -39.00                 0.71            +----------+-------------+----------+-----------+-------+ Distal ICA    32.00                                 +----------+-------------+----------+-----------+-------+  +---------+------------+----------+-----------+-------+ LEFT TCD Left VM (cm)Depth (cm)PulsatilityComment +---------+------------+----------+-----------+-------+ PCA         91.00                 0.59            +---------+------------+----------+-----------+-------+ Opthalmic   49.00                                 +---------+------------+----------+-----------+-------+ Vertebral   -33.00                 59             +---------+------------+----------+-----------+-------+  +------------+------+-------+             VM cm Comment  +------------+------+-------+ Prox Basilar-50.00        +------------+------+-------+ Dist Basilar-59.00        +------------+------+-------+ +----------------------+---+ Right Lindegaard Ratio5.0 +----------------------+---+  Summary:  Elevated mean flow velocities suggestive of moderate right middle crebral , mild right anterior cerebral, terminal right internal carotid, left posterior cerebral artery vasospasm.Normal mean flow velociti

## 2021-11-25 ENCOUNTER — Other Ambulatory Visit (HOSPITAL_COMMUNITY): Payer: BC Managed Care – PPO

## 2021-11-25 ENCOUNTER — Other Ambulatory Visit (HOSPITAL_COMMUNITY): Payer: Self-pay

## 2021-11-25 NOTE — Discharge Summary (Signed)
Physician Discharge Summary  ? ? ? ?Providing Compassionate, Quality Care - Together ? ? ?Patient ID: ?Debbie Armstrong ?MRN: [id number] ?DOB/AGE: Dec 25, 1985 36 y.o. ? ?Admit date: 11/08/2021 ?Discharge date: 11/25/2021 ? 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 ?Discharge Diagnoses:  ?Principal Problem: ?  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 ? ?Discharged Condition: good ? ?Hospital Course: Patient suffered an aneurysmal SAH on 11/08/2021. She underwent coiling of three ACA aneurysms by Dr.de Sindy Messing on 11/08/2021.  Her postoperative course has been uncomplicated. She has worked with both physical and occupational therapies who feel the patient is ready for discharge home. She is ambulating independently and without difficulty. She is tolerating a normal diet. She is not complaining of any headache or nausea presently. She is ready for discharge home. ? ? ?Consults: Interventional Radiology, PT/OT ? ?Significant Diagnostic Studies: radiology: CT Angio Head W or Wo Contrast ? ?Result Date: 11/08/2021 ?CLINICAL DATA:  Acute subarachnoid hemorrhage, aneurysmal pattern. EXAM: CT ANGIOGRAPHY HEAD TECHNIQUE: Multidetector CT imaging of the head was performed using the standard protocol during bolus administration of intravenous contrast. Multiplanar CT image reconstructions and MIPs were obtained to evaluate the vascular anatomy. RADIATION DOSE REDUCTION: This exam was performed according to the departmental dose-optimization program which includes automated exposure control, adjustment of the mA and/or kV according to patient size and/or use of iterative reconstruction technique. CONTRAST:  139mL OMNIPAQUE IOHEXOL 350 MG/ML SOLN COMPARISON:  Head CT earlier same day. FINDINGS: CTA HEAD Anterior circulation: The right cervical internal carotid artery is widely patent through the skull base and siphon regions. 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 It appears that the left cervical ICA supplies the left posterior cerebral artery and may give a tiny contribution to the left anterior and middle cerebral vessels, but that the majority of the flow to the left anterior circulation comes from the right carotid circulation through the anterior communicating artery complex. There appears to be an irregular aneurysm at the anterior communicating artery, difficult to measure precisely, but probably on the order of 4 mm in size. Posterior circulation: Both vertebral arteries are patent through the foramen magnum to the basilar. The left is dominant. The basilar artery is a small vessel that supplies the superior cerebellar arteries and give some contribution to the right PCA. Minimal if any contribution to the left PCA. Venous sinuses: Patent and normal. Anatomic variants: None other significant. Review of the MIP images confirms the above findings. IMPRESSION: Unusual baseline vascular anatomy. A small left internal carotid artery supplies the left posterior cerebral artery and gives a tiny contribution to the left anterior and middle cerebral vessels. 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 Catheter angiography may be necessary to understand the exact details of this vascular anatomy. Electronically Signed   By: Nelson Chimes M.D.   On: 11/08/2021 15:37  ? ?CT HEAD WO CONTRAST (5MM) ? ?Result Date: 11/09/2021 ?CLINICAL DATA:  36 year old female presenting with aneurysmal rupture pattern of subarachnoid hemorrhage yesterday. 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 3 mm paraophthalmic aneurysm. EXAM: CT HEAD WITHOUT CONTRAST TECHNIQUE: Contiguous axial images were  obtained from the base of the skull through the vertex without intravenous  contrast. RADIATION DOSE REDUCTION: This exam was performed according to the departmental dose-optimization program which includes automated exposure control, adjustment of the mA and/or kV according to patient size and/or use of iterative reconstruction technique. COMPARISON:  Presentation head CT 11/08/2021. FINDINGS: Brain: Aneurysmal pattern subarachnoid hemorrhage stable since yesterday, relatively sparing the cisterna magna and convexities. Small volume of IVH layering now in the lateral ventricles. No ventriculomegaly. No midline shift. No cortically based acute infarct identified. Stable gray-white matter differentiation throughout the brain. Unusual configuration of the anterior 3rd ventricle and suprasellar cistern, as well as prominent scattered dural calcifications. Some of the suprasellar cistern anatomy might be obscured by isodense blood also. Vascular: Multiple small anterior communicating artery region aneurysm coil packs now. No suspicious intracranial vascular hyperdensity. Skull: No acute osseous abnormality identified. Sinuses/Orbits: Visualized paranasal sinuses and mastoids are stable and well aerated. Other: Negative orbit and scalp soft tissues. IMPRESSION: 1. Stable Aneurysmal pattern SAH since presentation. Trace IVH with no ventriculomegaly or significant intracranial mass effect. 2. Several small new anterior communicating artery region endovascular coil packs. 3. No new intracranial abnormality. Electronically Signed   By: Genevie Ann M.D.   On: 11/09/2021 05:20  ? ?CT HEAD WO CONTRAST (5MM) ? ?Result Date: 11/08/2021 ?CLINICAL DATA:  Headache, severe headache and vomiting. EXAM: CT HEAD WITHOUT CONTRAST TECHNIQUE: Contiguous axial images were obtained from the base of the skull through the vertex without intravenous contrast. RADIATION DOSE REDUCTION: This exam was performed according to the departmental  dose-optimization program which includes automated exposure control, adjustment of the mA and/or kV according to patient size and/or use of iterative reconstruction technique. COMPARISON:  None. FINDINGS: Brain: Acute subarachnoid hemorrhage extending from the suprasellar cistern to the bilateral sylvian fissures and along the anterior falx, most dense at the suprasellar cistern suggesting aneurysm rupture. Small amount of additional acute hemorrhage within the third and fourth ventricle. No parenchymal hemorrhage or parenchymal edema is identified. 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 Skull: Normal. Negative for fracture or focal lesion. Sinuses/Orbits: No acute finding. Other: None. IMPRESSION: 1. 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 Small amount of additional acute hemorrhage within the third and fourth ventricle. Recommend immediate neurosurgery consultation and CT angiogram for more definitive characterization of the intracranial vasculature. 2. No parenchymal hemorrhage or parenchymal edema is identified. Critical Value/emergent results were called by telephone at the time of interpretation on 11/08/2021 at 1:28 pm to provider [facility] , who verbally acknowledged these results. Electronically Signed   By: Franki Cabot M.D.   On: 11/08/2021 13:33  ? ?IR Transcath/Emboliz ? ?Result Date: 11/09/2021 ?INDICATION: 36 year old female presents the onset headache and neck pain on 11/08/2021 around 11 a.m. Head CT showed subarachnoid hemorrhage in the basal cisterns with small hematoma at the level of the anterior interhemispheric fissure. CT angiogram of the head was suggestive of an anterior communicating artery aneurysm. EXAM: ULTRASOUND-GUIDED VASCULAR ACCESS DIAGNOSTIC CEREBRAL ANGIOGRAM 3D ROTATIONAL ANGIOGRAM  ENDOVASCULAR ANEURYSM EMBOLIZATION FLAT PANEL HEAD CT COMPARISON:  CT angiogram of the head November 08, 2021. MEDICATIONS: No antibiotics utilized. ANESTHESIA/SEDATION: The procedure was performed under general anesthesia. CON

## 2021-11-25 NOTE — TOC Transition Note (Signed)
Transition of Care (TOC) - CM/SW Discharge Note ? ? ?Patient Details  ?Name: Debbie Armstrong ?MRN: [id number] ?Date of Birth: 08/21/1985 ? ?Transition of Care (TOC) CM/SW Contact:  ?Beckie Busing, RN ?Phone [phone] ? ?11/25/2021, 1:07 PM ? ? ?Clinical Narrative:    ?CM at bedside to follow up with patient about d/c home and need for niMODipine script to be filled prior to d/c. Per patient she has been advised by MD that she will not need to take this medication anymore. NO other TOC needs noted at this time. TOC will sign off .  ? ? ?Final next level of care: Home/Self Care ?Barriers to Discharge: No Barriers Identified ? ? ?Patient Goals and CMS Choice ?  ?  ?Choice offered to / list presented to : NA ? ?Discharge Placement ?  ?           ?  ?  ?  ?  ? ?Discharge Plan and Services ?  ?  ?           ?DME Arranged: N/A ?DME Agency: NA ?  ?  ?  ?HH Arranged: NA ?HH Agency: NA ?  ?  ?  ? ?Social Determinants of Health (SDOH) Interventions ?  ? ? ?Readmission Risk Interventions ?   ? View : No data to display.  ?  ?  ?  ? ? ? ? ? ?

## 2021-11-25 NOTE — Progress Notes (Signed)
Pt discharged home. PIV removed, and all room belongings packed. Per Doran Durand, NP, pt did not need additional transcranial doppler prior to discharge. Pt ambulated to private vehicle accompanied by this RN. Pt educated on discharge information, with no questions at this time.  ? ?Robina Ade, RN ? ?

## 2021-11-25 NOTE — Progress Notes (Addendum)
? ? ?Supervising Physician: Baldemar Lenise Macedo Rodrigues, Willys Salvino ? ?[facility] ? ?Chief Complaint: ? 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 ?Subjective: ? ?Patient has discharge orders signed by neurosurgery. No headache, vomiting, nausea or focal neurological complaints. No more issues with muscle spasm. We will see her in 3 months for evaluation and cerebral angiogram to evaluate treated anterior communicating artery aneurysms and untreated right ICA aneurysm.  ? ?Allergies: ?Patient has no known allergies. ? ?Medications: ?Prior to Admission medications   ?Medication Sig Start Date End Date Taking? Authorizing Provider  ?Multiple Vitamins-Minerals (HAIR SKIN & NAILS ADVANCED) TABS Take 1 tablet by mouth daily.   Yes [provider]  ? ? ? ?Vital Signs: ?BP 128/73 (BP Location: Right Arm)   Pulse 60   Temp 98.7 ?F (37.1 ?C) (Oral)   Resp (!) 21   Ht 5\' 6"  (1.676 m)   Wt 205 lb (93 kg)   SpO2 100%   BMI 33.09 kg/m?  ? ?Physical Exam ?Constitutional:   ?   General: She is not in acute distress. ?   Appearance: She is not ill-appearing.  ?HENT:  ?   Head: Normocephalic.  ?   Mouth/Throat:  ?   Pharynx: Oropharynx is clear.  ?Eyes:  ?   Extraocular Movements: Extraocular movements intact.  ?Cardiovascular:  ?   Rate and Rhythm: Normal rate.  ?Pulmonary:  ?   Effort: Pulmonary effort is normal.  ?Neurological:  ?   General: No focal deficit present.  ?   Mental Status: She is alert.  ?Psychiatric:     ?   Mood and Affect: Mood normal.     ?   Behavior: Behavior normal.  ? ? ?Imaging: ?VAS US TRANSCRANIAL DOPPLER ? ?Result Date: 11/24/2021 ? Transcranial Doppler Patient Name:  Debbie Armstrong  Date of Exam:   11/23/2021 Medical Rec #: [id number]    Accession #:    [id number] Date of Birth: 03/02/1986    Patient Gender: F Patient Age:   36 years [facility] Procedure:      VAS US TRANSCRANIAL DOPPLER Referring Phys: Ronaldo MiyamotoKYLE CABBELL  --------------------------------------------------------------------------------  Indications: Subarachnoid hemorrhage. Limitations for diagnostic windows: Unable to insonate right transtemporal window. Unable to insonate left transtemporal window. Comparison Study: Most recent prior study on 11-20-21 Performing Technologist: Jean Rosenthalachel Hodge RDMS, RVT  Examination Guidelines: A complete evaluation includes B-mode imaging, spectral Doppler, color Doppler, and power Doppler as needed of all accessible portions of each vessel. Bilateral testing is considered an integral part of a complete examination. Limited examinations for reoccurring indications may be performed as noted.  +----------+-------------+----------+-----------+------------------+ RIGHT TCD Right VM (cm)Depth (cm)Pulsatility     Comment       +----------+-------------+----------+-----------+------------------+ MCA                                         Unable to insonate +----------+-------------+----------+-----------+------------------+ ACA                                         Unable to insonate +----------+-------------+----------+-----------+------------------+ Term ICA                                    Unable  to insonate +----------+-------------+----------+-----------+------------------+ PCA                                         Unable to insonate +----------+-------------+----------+-----------+------------------+ Opthalmic     16.00                 1.25                       +----------+-------------+----------+-----------+------------------+ ICA siphon    35.00                 1.69                       +----------+-------------+----------+-----------+------------------+ Vertebral    -34.00                 0.68                       +----------+-------------+----------+-----------+------------------+  +----------+------------+----------+-----------+------------------+ LEFT TCD  Left VM  (cm)Depth (cm)Pulsatility     Comment       +----------+------------+----------+-----------+------------------+ MCA                                        Unable to insonate +----------+------------+----------+-----------+------------------+ ACA                                        Unable to insonate +----------+------------+----------+-----------+------------------+ Term ICA                                   Unable to insonate +----------+------------+----------+-----------+------------------+ PCA                                        Unable to insonate +----------+------------+----------+-----------+------------------+ Opthalmic    14.00                 0.84                       +----------+------------+----------+-----------+------------------+ ICA siphon   32.00                 2.07                       +----------+------------+----------+-----------+------------------+ Vertebral    -29.00                1.06                       +----------+------------+----------+-----------+------------------+  +------------+------+-------+             VM cm Comment +------------+------+-------+ Prox Basilar-43.00        +------------+------+-------+ Dist Basilar-56.00        +------------+------+-------+ Summary:  Absent bitemporal windows limits evaluation. Normal mean flow velocities in identified vessels of anterior and posterior circulation.No definite evidence of vasospasm noted. *See table(s) above for TCD measurements and observations.  Diagnosing physician: Delia Heady MD Electronically signed by Delia Heady MD on 11/24/2021 at 10:52:17 AM.  Final    ? ?Labs: ? ?CBC: ?Recent Labs  ?  11/08/21 ?1334 11/08/21 ?1720 11/17/21 ?2536  ?WBC 15.7* 18.1* 7.8  ?HGB 13.6 13.8 13.2  ?HCT 41.5 42.9 40.1  ?PLT 245 264 257  ? ? ?COAGS: ?Recent Labs  ?  11/08/21 ?1626  ?INR 1.1  ?APTT 24  ? ? ?BMP: ?Recent Labs  ?  11/08/21 ?1334 11/17/21 ?6440  ?NA 138 133*  ?K 3.2*  3.6  ?CL 110 103  ?CO2 20* 21*  ?GLUCOSE 115* 95  ?BUN 11 8  ?CALCIUM 8.5* 8.7*  ?CREATININE 0.68 0.74  ?GFRNONAA >60 >60  ? ? ?LIVER FUNCTION TESTS: ?No results for input(s): BILITOT, AST, ALT, ALKPHOS, PROT, ALBUMIN in the last 8760 hours. ? ?Assessment and Plan: ? ?Sub-arachnoid hemorrhage, cerebral aneurysm ?--recovering well ?--for d/c today ?--will f/u in 67mos in clinic ? ?Electronically Signed: ?Sheliah Plane, PA ?11/25/2021, 1:40 PM ? ? ?I spent a total of 15 Minutes at the the patient's bedside AND on the patient's hospital floor or unit, greater than 50% of which was counseling/coordinating care for cerebral aneurysm. ? ? ? ? ? ?

## 2021-11-25 NOTE — TOC Progression Note (Signed)
Transition of Care (TOC) - Progression Note  ? ? ?Patient Details  ?Name: Asjah Rauda ?MRN: [id number] ?Date of Birth: [birth year] ? ?Transition of Care (TOC) CM/SW Contact  ?Beckie Busing, RN ?Phone [phone] ? ?11/25/2021, 10:42 AM ? ?Clinical Narrative:    ?MD please send script for niMODipine to [facility] pharmacy to fill prior to d/c. ? ? ?  ?  ? ?Expected Discharge Plan and Services ?  ?  ?  ?  ?  ?                ?  ?  ?  ?  ?  ?  ?  ?  ?  ?  ? ? ?Social Determinants of Health (SDOH) Interventions ?  ? ?Readmission Risk Interventions ?   ? View : No data to display.  ?  ?  ?  ? ? ?

## 2021-11-26 ENCOUNTER — Telehealth: Payer: Self-pay

## 2021-11-26 NOTE — Telephone Encounter (Signed)
Transition Care Management Follow-up Telephone Call ?Date of discharge and from where: 11/25/2021-[facility] ?How have you been since you were released from the hospital? Pt stated she is doing fine ?Any questions or concerns? No ? ?Items Reviewed: ?Did the pt receive and understand the discharge instructions provided? Yes  ?Medications obtained and verified?  Patient stated she was advised she didn't have to take any new medications.  ?Other? No  ?Any new allergies since your discharge? No  ?Dietary orders reviewed? No ?Do you have support at home? Yes  ? ?Home Care and Equipment/Supplies: ?Were home health services ordered? not applicable ?If so, what is the name of the agency? N/A  ?Has the agency set up a time to come to the patient's home? not applicable ?Were any new equipment or medical supplies ordered?  No ?What is the name of the medical supply agency? N/A ?Were you able to get the supplies/equipment? not applicable ?Do you have any questions related to the use of the equipment or supplies? No ? ?Functional Questionnaire: (I = Independent and D = Dependent) ?ADLs: I ? ?Bathing/Dressing- I ? ?Meal Prep- I ? ?Eating- I ? ?Maintaining continence- I ? ?Transferring/Ambulation- I ? ?Managing Meds- I ? ?Follow up appointments reviewed: ? ?PCP Hospital f/u appt confirmed? No   ?Specialist Hospital f/u appt confirmed? No   ?Are transportation arrangements needed? No  ?If their condition worsens, is the pt aware to call PCP or go to the Emergency Dept.? Yes ?Was the patient provided with contact information for the PCP's office or ED? Yes ?Was to pt encouraged to call back with questions or concerns? Yes  ?

## 2021-11-30 NOTE — Patient Outreach (Signed)
Received a red flag Emmi stroke notification for Ms. Colan . ?I have assigned Valente David, RN to call for follow up and determine if there are any Case Management needs.  ?  ?Arville Care, CBCS, CMAA ?[place] Management Assistant ?[place] Management ?[phone]   ?

## 2021-12-01 ENCOUNTER — Other Ambulatory Visit: Payer: Self-pay | Admitting: *Deleted

## 2021-12-01 NOTE — Patient Outreach (Signed)
[facility]) Care Management ? ?12/01/2021 ? ?Talissa Apple ?Aug 28, 1985 ?[id number] ? ? ?RED ON EMMI ALERT - Stroke ?Day # 1 ?Date: 4/28 ?Red Alert Reason: Scheduled follow up appointment?  NO ? ? ?Outreach attempt #1, successful.  Identity verified.  This care manager introduced self and stated purpose of call.  [facility] care management services explained.   ? ?She was admitted to hospital 4/9-4/26 for subarachnoid hemmorrhage due to cerebral aneurysm requiring coiling.  State she has been recovering well.  Her mother, sister, and children have all been assisting when needed.  She remains independent, including driving self to appointments.  Denies having any ongoing headaches.  Still does not have any follow up appointments yet.  Per discharge instructions, recommended to see Neurosurgeon in 2 weeks, PCP, and IR in 3 months.  She has contact information for a few primary providers, will call today to schedule appointment.  Noted that IR will call member.  This care manager called to Neurosurgeon office, voice message left.  Denies any urgent concerns, encouraged to contact this care manager with questions.   ? ?Plan: ?RN CM will follow up with neurosurgeon office within the next 3 business days. ? ?Kemper Durie, RN, MSN, CCM ?[facility] Care Management  ?Community Care Manager ?[phone] ? ?

## 2021-12-02 ENCOUNTER — Other Ambulatory Visit: Payer: Self-pay | Admitting: *Deleted

## 2021-12-02 NOTE — Patient Outreach (Signed)
[facility]) Care Management ? ?12/02/2021 ? ?Avonne Berkery [birth year] ?[phone] ? ? ?Outgoing call placed to Neurosurgeon office to request follow up appointment.  Notified member has one scheduled for 5/15 at 1145.  This was scheduled with member directly.  Will follow up with member within the next 2 weeks. ? ?Kemper Durie, RN, MSN, CCM ?[facility] Care Management  ?Community Care Manager ?[phone] ? ?

## 2021-12-14 DIAGNOSIS — I607 Nontraumatic subarachnoid hemorrhage from unspecified intracranial artery: Secondary | ICD-10-CM | POA: Diagnosis not present

## 2021-12-16 ENCOUNTER — Other Ambulatory Visit: Payer: Self-pay | Admitting: *Deleted

## 2021-12-16 NOTE — Patient Outreach (Signed)
[facility]) Care Management ? ?12/16/2021 ? ?Teneil Shiller ?1986-04-17 ?[id number] ? ?Outgoing call placed to member, state she is doing well.  Denies any ongoing headaches or complications of procedure.  Follow up with neurosurgeon completed on 5/15, denies any concerns from provider.  Establishing with new primary provider, first appointment scheduled for 5/23.  Denies any urgent concerns, encouraged to contact this care manager with questions.  Will close case at this time, no further needs identified. ? ?Kemper Durie, RN, MSN, CCM ?[facility] Care Management  ?Community Care Manager ?[phone] ? ?

## 2021-12-24 ENCOUNTER — Encounter: Payer: Self-pay | Admitting: Internal Medicine

## 2021-12-24 ENCOUNTER — Ambulatory Visit: Payer: BC Managed Care – PPO | Attending: Internal Medicine | Admitting: Internal Medicine

## 2021-12-24 VITALS — BP 109/75 | HR 73 | Ht 66.0 in | Wt 202.6 lb

## 2021-12-24 DIAGNOSIS — Z7689 Persons encountering health services in other specified circumstances: Secondary | ICD-10-CM | POA: Diagnosis not present

## 2021-12-24 DIAGNOSIS — Z8679 Personal history of other diseases of the circulatory system: Secondary | ICD-10-CM | POA: Diagnosis not present

## 2021-12-24 NOTE — Progress Notes (Signed)
Patient ID: Debbie Armstrong, female    DOB: 17-Jul-1986  MRN: [id number]  CC: New Patient (Initial Visit)   Subjective: Debbie Armstrong is a 36 y.o. female who presents for new patient visit and hospital follow-up. Her concerns today include:   Patient hospitalized 4/9-20 12/2021 with SAH due to cerebral aneurysm.  She underwent coiling of 3 ACA aneurysms by interventional radiologist Dr. Dorice Lamas.  She did well postoperatively and worked with physical therapy and Occupational Therapy.  Discharged home ambulating independently without difficulty.  Today: Patient is doing well.  She denies any headaches or dizziness, no blurred vision, no nausea or vomiting.  She has seen the neurosurgeon Dr. Franky Macho in follow-up last week.  She states he was pleased with her outcome and progress.  She was told to follow-up with him again in 3 months after she has had repeat CTA of the head.  She was supposed to follow-up with the interventional radiologist Dr. Sherlon Handing in 3 months for this.  They were supposed to call her with an appointment but she has not received a call as yet.  HM:  due for pap.  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     No current facility-administered medications on file prior to visit.    No Known Allergies  Social History   Socioeconomic History   Marital status: Single    Spouse name: Not on file   Number of children: 2   Years of education: Not on file   Highest education level: Some college, no degree  Occupational History   Occupation: Packing and shipping  Tobacco Use   Smoking status: Never   Smokeless tobacco: Never  Vaping Use   Vaping Use: Former  Substance and Sexual Activity   Alcohol use:  Never   Drug use: Never   Sexual activity: Yes  Other Topics Concern   Not on file  Social History Narrative   Not on file   Social Determinants of Health   Financial Resource Strain: Not on file  Food Insecurity: Not on file  Transportation Needs: Not on file  Physical Activity: Not on file  Stress: Not on file  Social Connections: Not on file  Intimate Partner Violence: Not on file    Family History  Problem Relation Age of Onset   Hypertension Mother    Diabetes Son      ROS: Review of Systems Negative except as stated above  PHYSICAL EXAM: BP 109/75   Pulse 73   Ht 5\' 6"  (1.676 m)   Wt 202 lb 9.6 oz (91.9 kg)   SpO2 98%   BMI 32.70 kg/m   Physical Exam  General appearance - alert, well appearing, middle-age female and in no distress Mental status - normal mood, behavior, speech, dress, motor activity, and thought processes Eyes - pupils equal and reactive, extraocular eye movements intact Neck - supple, no significant adenopathy Chest - clear to auscultation, no wheezes, rales or rhonchi, symmetric air entry Heart - normal rate, regular rhythm, normal S1, S2, no murmurs, rubs, clicks or gallops Extremities - peripheral pulses normal, no pedal edema, no clubbing or cyanosis      Latest Ref Rng & Units 11/17/2021    3:17 AM  11/08/2021    1:34 PM  CMP  Glucose 70 - 99 mg/dL 95   115    BUN 6 - 20 mg/dL 8   11    Creatinine 0.44 - 1.00 mg/dL 0.74   0.68    Sodium 135 - 145 mmol/L 133   138    Potassium 3.5 - 5.1 mmol/L 3.6   3.2    Chloride 98 - 111 mmol/L 103   110    CO2 22 - 32 mmol/L 21   20    Calcium 8.9 - 10.3 mg/dL 8.7   8.5     Lipid Panel  No results found for: CHOL, TRIG, HDL, CHOLHDL, VLDL, LDLCALC, LDLDIRECT  CBC    Component Value Date/Time   WBC 7.8 11/17/2021 0317   RBC 4.35 11/17/2021 0317   HGB 13.2 11/17/2021 0317   HCT 40.1 11/17/2021 0317   PLT 257 11/17/2021 0317   MCV 92.2 11/17/2021 0317   MCH 30.3 11/17/2021 0317   MCHC  32.9 11/17/2021 0317   RDW 12.3 11/17/2021 0317   LYMPHSABS 2.1 11/17/2021 0317   MONOABS 0.8 11/17/2021 0317   EOSABS 0.2 11/17/2021 0317   BASOSABS 0.1 11/17/2021 0317    ASSESSMENT AND PLAN: 1. Establishing care with new doctor, encounter for 2. History of subarachnoid hemorrhage Patient is doing well with no post hemorrhage headaches.  Blood pressure is well controlled.  I have provided her with the phone number to [facility] radiology so that she can schedule follow-up with the interventional radiologist for mid July.  She will follow-up with me in 6 weeks for Pap smear.   Patient was given the opportunity to ask questions.  Patient verbalized understanding of the plan and was able to repeat key elements of the plan.   This documentation was completed using Radio producer.  Any transcriptional errors are unintentional.  No orders of the defined types were placed in this encounter.    Requested Prescriptions    No prescriptions requested or ordered in this encounter    Return in about 6 weeks (around 02/04/2022) for PAP.  Karle Plumber, MD, FACP

## 2021-12-24 NOTE — Patient Instructions (Signed)
Please call [facility] Radiology and schedule your follow-up appointment with the interventional radiologist Dr. Dorice Lamas for mid July.  At that time they plan to do a repeat cerebral angiogram for follow-up on coiling procedure that was done to stop the bleeding of brain aneurysm.  Telephone numbers are [phone] or [phone].

## 2021-12-24 NOTE — Progress Notes (Signed)
HFU 

## 2022-01-19 ENCOUNTER — Other Ambulatory Visit (HOSPITAL_COMMUNITY): Payer: Self-pay | Admitting: Physician Assistant

## 2022-01-19 DIAGNOSIS — I609 Nontraumatic subarachnoid hemorrhage, unspecified: Secondary | ICD-10-CM

## 2022-02-03 ENCOUNTER — Other Ambulatory Visit (HOSPITAL_COMMUNITY): Payer: Self-pay | Admitting: Physician Assistant

## 2022-02-03 DIAGNOSIS — I608 Other nontraumatic subarachnoid hemorrhage: Secondary | ICD-10-CM

## 2022-02-04 ENCOUNTER — Encounter (HOSPITAL_COMMUNITY): Payer: Self-pay

## 2022-02-04 ENCOUNTER — Other Ambulatory Visit (HOSPITAL_COMMUNITY): Payer: Self-pay | Admitting: Physician Assistant

## 2022-02-04 ENCOUNTER — Ambulatory Visit (HOSPITAL_COMMUNITY)
Admission: RE | Admit: 2022-02-04 | Discharge: 2022-02-04 | Disposition: A | Discharge status: Home / Self Care | Payer: BC Managed Care – PPO | Source: Ambulatory Visit | Attending: Physician Assistant | Admitting: Physician Assistant

## 2022-02-04 ENCOUNTER — Other Ambulatory Visit: Payer: Self-pay

## 2022-02-04 DIAGNOSIS — I609 Nontraumatic subarachnoid hemorrhage, unspecified: Secondary | ICD-10-CM

## 2022-02-04 DIAGNOSIS — I608 Other nontraumatic subarachnoid hemorrhage: Secondary | ICD-10-CM

## 2022-02-04 DIAGNOSIS — I671 Cerebral aneurysm, nonruptured: Secondary | ICD-10-CM | POA: Diagnosis present

## 2022-02-04 HISTORY — PX: IR ANGIO INTRA EXTRACRAN SEL INTERNAL CAROTID BILAT MOD SED: IMG5363

## 2022-02-04 HISTORY — PX: IR 3D INDEPENDENT WKST: IMG2385

## 2022-02-04 HISTORY — PX: IR US GUIDE VASC ACCESS RIGHT: IMG2390

## 2022-02-04 HISTORY — PX: IR ANGIO VERTEBRAL SEL VERTEBRAL BILAT MOD SED: IMG5369

## 2022-02-04 LAB — BASIC METABOLIC PANEL
Anion gap: 6 (ref 5–15)
BUN: 20 mg/dL (ref 6–20)
CO2: 24 mmol/L (ref 22–32)
Calcium: 8.8 mg/dL — ABNORMAL LOW (ref 8.9–10.3)
Chloride: 110 mmol/L (ref 98–111)
Creatinine, Ser: 0.85 mg/dL (ref 0.44–1.00)
GFR, Estimated: >60 mL/min (ref >60)
Glucose, Bld: 93 mg/dL (ref 70–99)
Potassium: 4 mmol/L (ref 3.5–5.1)
Sodium: 140 mmol/L (ref 135–145)

## 2022-02-04 LAB — CBC
HCT: 39.5 % (ref 36.0–46.0)
Hemoglobin: 12.6 g/dL (ref 12.0–15.0)
MCH: 29.9 pg (ref 26.0–34.0)
MCHC: 31.9 g/dL (ref 30.0–36.0)
MCV: 93.6 fL (ref 80.0–100.0)
Platelets: 264 10*3/uL (ref 150–400)
RBC: 4.22 MIL/uL (ref 3.87–5.11)
RDW: 12.5 % (ref 11.5–15.5)
WBC: 6.2 10*3/uL (ref 4.0–10.5)
nRBC: 0 % (ref 0.0–0.2)

## 2022-02-04 LAB — PROTIME-INR
INR: 1 (ref 0.8–1.2)
Prothrombin Time: 13.4 seconds (ref 11.4–15.2)

## 2022-02-04 LAB — PREGNANCY, URINE: Preg Test, Ur: NEGATIVE

## 2022-02-04 MED ORDER — IOHEXOL 300 MG/ML  SOLN
100.0000 mL | Freq: Once | INTRAMUSCULAR | Status: AC | PRN
  Administered 2022-02-04: 35 mL via INTRA_ARTERIAL

## 2022-02-04 MED ORDER — ACETAMINOPHEN 325 MG PO TABS: 650.0000 mg | ORAL_TABLET | Freq: Four times a day (QID) | ORAL | Status: DC | PRN

## 2022-02-04 MED ORDER — IOHEXOL 300 MG/ML  SOLN
100.0000 mL | Freq: Once | INTRAMUSCULAR | Status: AC | PRN
  Administered 2022-02-04: 30 mL via INTRA_ARTERIAL

## 2022-02-04 MED ORDER — MIDAZOLAM HCL 2 MG/2ML IJ SOLN
INTRAMUSCULAR | Status: AC | PRN
  Administered 2022-02-04: 1 mg via INTRAVENOUS

## 2022-02-04 MED ORDER — NITROGLYCERIN 1 MG/10 ML FOR IR/CATH LAB
INTRA_ARTERIAL | Status: AC
  Filled 2022-02-04: qty 10

## 2022-02-04 MED ORDER — FENTANYL CITRATE (PF) 100 MCG/2ML IJ SOLN
INTRAMUSCULAR | Status: AC | PRN
  Administered 2022-02-04: 25 ug via INTRAVENOUS

## 2022-02-04 MED ORDER — MIDAZOLAM HCL 2 MG/2ML IJ SOLN
INTRAMUSCULAR | Status: AC
  Filled 2022-02-04: qty 2

## 2022-02-04 MED ORDER — FENTANYL CITRATE (PF) 100 MCG/2ML IJ SOLN
INTRAMUSCULAR | Status: AC
  Filled 2022-02-04: qty 2

## 2022-02-04 MED ORDER — SODIUM CHLORIDE 0.9 % IV SOLN
INTRAVENOUS | Status: DC
Start: 2022-02-04 — End: 2022-02-05

## 2022-02-04 MED ORDER — HEPARIN SODIUM (PORCINE) 1000 UNIT/ML IJ SOLN
INTRAMUSCULAR | Status: AC
  Filled 2022-02-04: qty 10

## 2022-02-04 MED ORDER — LIDOCAINE HCL 1 % IJ SOLN
INTRAMUSCULAR | Status: AC
  Filled 2022-02-04: qty 20

## 2022-02-04 MED ORDER — VERAPAMIL HCL 2.5 MG/ML IV SOLN: INTRA_ARTERIAL | Status: AC | PRN

## 2022-02-04 MED ORDER — VERAPAMIL HCL 2.5 MG/ML IV SOLN
INTRAVENOUS | Status: AC
  Filled 2022-02-04: qty 2

## 2022-02-04 NOTE — Sedation Documentation (Signed)
Vital signs stable. 

## 2022-02-04 NOTE — Progress Notes (Signed)
Patient and sister was given discharge instructions. Both verbalized understanding. 

## 2022-02-04 NOTE — Sedation Documentation (Signed)
Patient is resting comfortably. 

## 2022-02-04 NOTE — Sedation Documentation (Signed)
Patient is resting comfortably. No complaints at this time, pt able to follow directions, VSS. Procedure continues.

## 2022-02-04 NOTE — Sedation Documentation (Signed)
Report given to Maureen Ralphs, RN SS via phone and at bedside. Pt vitals stable at hand off , right distal radial site level 0

## 2022-02-04 NOTE — Sedation Documentation (Signed)
Patient is resting comfortably. Pt able to follow directions and has no complaints at this time, procedure continues

## 2022-02-04 NOTE — Sedation Documentation (Signed)
Pt tolerated procedure very well. No complaints of pain at this time. Distal radial site level 0, right.  Totals: Time-42 mins Fentanyl Versed 1mg  Radial cocktail nitroglycerin, 5000 Units heparin, 5mg  verapamil

## 2022-02-04 NOTE — Sedation Documentation (Signed)
Patient is resting comfortably. Procedure started °

## 2022-02-04 NOTE — Procedures (Signed)
INTERVENTIONAL NEURORADIOLOGY BRIEF POSTPROCEDURE NOTE  DIAGNOSTIC CEREBRAL ANGIOGRAM  Attending: Dr. Baldemar Lenis  Diagnosis: AComA aneurysm s/p embolization; right ICA aneurysm, untreated.  Access site: Distal right radial artery  Access closure: Inflatable band  Anesthesia: Moderate sedation  Medication used: 1 mg Versed IV; 25 mcg Fentanyl IV.  Complications: None.  Estimated blood loss: None.  Specimen: None.  Findings:   Complete occlusion of the previously coiled small anterior communicating artery aneurysms without evidence of coil compaction.  An additional tiny (1-2 mm) posteriorly projecting outpouching from the anterior communicating artery is only seen on post processed 3D angiogram images and may represent an additional aneurysm versus infundibulum. Similar appearance of a 3.5 x 2.6 cm laterally projecting right ICA ophthalmic segment aneurysm.]  The patient tolerated the procedure well without incident or complication and is in stable condition.

## 2022-02-04 NOTE — H&P (Signed)
Chief Complaint: Cerebral aneurysm  Referring Physician(s): Dr. Coletta Memos  Supervising Physician: Baldemar Lenis  Patient Status: [facility] - Out-pt  History of Present Illness: Debbie Armstrong is a 36 y.o. female who suffered a subarachnoid hemorrhage on 11/08/2021 due to cerebral aneurysm.  She underwent coil embolization of three small anterior communicating artery aneurysm is measuring 1.6 mm, 1.5 mm and 1.2 mm by Dr. Tommie Sams.  She was discharged home on 11/25/21.  She is here today for cerebral angiography.  She is NPO. No nausea/vomiting. No Fever/chills. ROS negative.  History reviewed. No pertinent past medical history.  Past Surgical History:  Procedure Laterality Date   IR 3D INDEPENDENT WKST  11/08/2021   IR ANGIO INTRA EXTRACRAN SEL INTERNAL CAROTID BILAT MOD SED  11/08/2021   IR ANGIO VERTEBRAL SEL VERTEBRAL UNI L MOD SED  11/08/2021   IR ANGIOGRAM FOLLOW UP STUDY  11/08/2021   IR ANGIOGRAM FOLLOW UP STUDY  11/08/2021   IR ANGIOGRAM FOLLOW UP STUDY  11/08/2021   IR NEURO EACH ADD'L AFTER BASIC UNI RIGHT (MS)  11/08/2021   IR TRANSCATH/EMBOLIZ  11/08/2021   IR US GUIDE VASC ACCESS RIGHT  11/08/2021   RADIOLOGY WITH ANESTHESIA N/A 11/08/2021   Procedure: INTERVENTIONAL RADIOLOGY WITH ANESTHESIA;  Surgeon: Radiologist, Medication, MD;  Location: [facility] OR;  Service: Radiology;  Laterality: N/A;    Allergies: Patient has no known allergies.  Medications: Prior to Admission medications   Medication Sig Start Date End Date Taking? Authorizing Provider  acetaminophen (TYLENOL) 500 MG tablet Take 1,000 mg by mouth every 6 (six) hours as needed for moderate pain.   Yes [provider]  Multiple Vitamins-Minerals (HAIR SKIN & NAILS ADVANCED) TABS Take 1 tablet by mouth daily.   Yes [provider]     Family History  Problem Relation Age of Onset   Hypertension Mother    Diabetes Son     Social History   Socioeconomic History   Marital  status: Single    Spouse name: Not on file   Number of children: 2   Years of education: Not on file   Highest education level: Some college, no degree  Occupational History   Occupation: Corporate treasurer  Tobacco Use   Smoking status: Never   Smokeless tobacco: Never  Vaping Use   Vaping Use: Former  Substance and Sexual Activity   Alcohol use: Never   Drug use: Never   Sexual activity: Yes  Other Topics Concern   Not on file  Social History Narrative   Not on file   Social Determinants of Health   Financial Resource Strain: Not on file  Food Insecurity: Not on file  Transportation Needs: Not on file  Physical Activity: Not on file  Stress: Not on file  Social Connections: Not on file     Review of Systems: A 12 point ROS discussed and pertinent positives are indicated in the HPI above.  All other systems are negative.  Review of Systems  Vital Signs: BP 115/88   Pulse 64   Temp 97.9 F (36.6 C) (Temporal)   Resp 18   Ht 5\' 6"  (1.676 m)   Wt 206 lb (93.4 kg)   SpO2 100%   BMI 33.25 kg/m   Physical Exam Vitals reviewed.  Constitutional:      Appearance: Normal appearance.  HENT:     Head: Normocephalic and atraumatic.  Eyes:     Extraocular Movements: Extraocular movements intact.  Cardiovascular:     Rate and Rhythm: Normal rate and regular rhythm.  Pulmonary:     Effort: Pulmonary effort is normal. No respiratory distress.     Breath sounds: Normal breath sounds.  Abdominal:     Palpations: Abdomen is soft.  Musculoskeletal:        General: Normal range of motion.     Cervical back: Normal range of motion.  Skin:    General: Skin is warm and dry.  Neurological:     General: No focal deficit present.     Mental Status: She is alert and oriented to person, place, and time.  Psychiatric:        Mood and Affect: Mood normal.        Behavior: Behavior normal.        Thought Content: Thought content normal.        Judgment: Judgment normal.      Imaging: No results found.  Labs:  CBC: Recent Labs    11/08/21 1334 11/08/21 1720 11/17/21 0317 02/04/22 0726  WBC 15.7* 18.1* 7.8 6.2  HGB 13.6 13.8 13.2 12.6  HCT 41.5 42.9 40.1 39.5  PLT 245 264 257 264    COAGS: Recent Labs    11/08/21 1626  INR 1.1  APTT 24    BMP: Recent Labs    11/08/21 1334 11/17/21 0317  NA 138 133*  K 3.2* 3.6  CL 110 103  CO2 20* 21*  GLUCOSE 115* 95  BUN 11 8  CALCIUM 8.5* 8.7*  CREATININE 0.68 0.74  GFRNONAA >60 >60    LIVER FUNCTION TESTS: No results for input(s): "BILITOT", "AST", "ALT", "ALKPHOS", "PROT", "ALBUMIN" in the last 8760 hours.  TUMOR MARKERS: No results for input(s): "AFPTM", "CEA", "CA199", "CHROMGRNA" in the last 8760 hours.  Assessment and Plan:  History of SAH = s/p coil embolization of three small anterior communicating artery aneurysm is measuring 1.6 mm, 1.5 mm and 1.2 mm.  Will proceed with cerebral angiography today by Dr. Tommie Sams   Risks and benefits of cerebral angiogram with intervention were discussed with the patient including, but not limited to bleeding, infection, vascular injury, contrast induced renal failure, stroke or even death.  This interventional procedure involves the use of X-rays and because of the nature of the planned procedure, it is possible that we will have prolonged use of X-ray fluoroscopy.  Potential radiation risks to you include (but are not limited to) the following: - A slightly elevated risk for cancer  several years later in life. This risk is typically less than 0.5% percent. This risk is low in comparison to the normal incidence of human cancer, which is 33% for women and 50% for men according to the [employer] Cancer Society. - Radiation induced injury can include skin redness, resembling a rash, tissue breakdown / ulcers and hair loss (which can be temporary or permanent).   The likelihood of either of these occurring depends on the difficulty  of the procedure and whether you are sensitive to radiation due to previous procedures, disease, or genetic conditions.   IF your procedure requires a prolonged use of radiation, you will be notified and given written instructions for further action.  It is your responsibility to monitor the irradiated area for the 2 weeks following the procedure and to notify your physician if you are concerned that you have suffered a radiation induced injury.    All of the patient's questions were answered, patient is agreeable to proceed.  Consent signed and in chart.  Thank you for allowing our service to participate in Debbie Armstrong 's care.  Electronically Signed: Gwynneth Macleod, PA-C   02/04/2022, 7:34 AM      I spent a total of  15 Minutes  in face to face in clinical consultation, greater than 50% of which was counseling/coordinating care for cerebral angiography.

## 2022-02-07 ENCOUNTER — Emergency Department (HOSPITAL_COMMUNITY): Payer: BC Managed Care – PPO

## 2022-02-07 ENCOUNTER — Encounter (HOSPITAL_COMMUNITY): Payer: Self-pay

## 2022-02-07 ENCOUNTER — Observation Stay (HOSPITAL_COMMUNITY): Payer: BC Managed Care – PPO

## 2022-02-07 ENCOUNTER — Inpatient Hospital Stay (HOSPITAL_COMMUNITY)
Admission: EM | Admit: 2022-02-07 | Discharge: 2022-02-11 | DRG: 065 | Disposition: A | Discharge status: Home / Self Care | Payer: BC Managed Care – PPO | Attending: Family Medicine | Admitting: Family Medicine

## 2022-02-07 ENCOUNTER — Other Ambulatory Visit: Payer: Self-pay

## 2022-02-07 DIAGNOSIS — E669 Obesity, unspecified: Secondary | ICD-10-CM | POA: Diagnosis present

## 2022-02-07 DIAGNOSIS — R21 Rash and other nonspecific skin eruption: Secondary | ICD-10-CM | POA: Diagnosis not present

## 2022-02-07 DIAGNOSIS — Z8249 Family history of ischemic heart disease and other diseases of the circulatory system: Secondary | ICD-10-CM

## 2022-02-07 DIAGNOSIS — Z8679 Personal history of other diseases of the circulatory system: Secondary | ICD-10-CM

## 2022-02-07 DIAGNOSIS — I639 Cerebral infarction, unspecified: Secondary | ICD-10-CM | POA: Diagnosis not present

## 2022-02-07 DIAGNOSIS — Z833 Family history of diabetes mellitus: Secondary | ICD-10-CM

## 2022-02-07 DIAGNOSIS — T508X5A Adverse effect of diagnostic agents, initial encounter: Secondary | ICD-10-CM | POA: Diagnosis not present

## 2022-02-07 DIAGNOSIS — I63541 Cerebral infarction due to unspecified occlusion or stenosis of right cerebellar artery: Principal | ICD-10-CM | POA: Diagnosis present

## 2022-02-07 DIAGNOSIS — R200 Anesthesia of skin: Secondary | ICD-10-CM

## 2022-02-07 DIAGNOSIS — Z8673 Personal history of transient ischemic attack (TIA), and cerebral infarction without residual deficits: Secondary | ICD-10-CM

## 2022-02-07 DIAGNOSIS — Q2112 Patent foramen ovale: Secondary | ICD-10-CM

## 2022-02-07 DIAGNOSIS — Z6833 Body mass index (BMI) 33.0-33.9, adult: Secondary | ICD-10-CM

## 2022-02-07 DIAGNOSIS — E785 Hyperlipidemia, unspecified: Secondary | ICD-10-CM | POA: Diagnosis present

## 2022-02-07 DIAGNOSIS — R29701 NIHSS score 1: Secondary | ICD-10-CM | POA: Diagnosis present

## 2022-02-07 DIAGNOSIS — Z888 Allergy status to other drugs, medicaments and biological substances status: Secondary | ICD-10-CM

## 2022-02-07 HISTORY — DX: Aneurysm of unspecified site: I72.9

## 2022-02-07 LAB — I-STAT CHEM 8, ED
BUN: 9 mg/dL (ref 6–20)
Calcium, Ion: 1.12 mmol/L — ABNORMAL LOW (ref 1.15–1.40)
Chloride: 104 mmol/L (ref 98–111)
Creatinine, Ser: 0.7 mg/dL (ref 0.44–1.00)
Glucose, Bld: 90 mg/dL (ref 70–99)
HCT: 41 % (ref 36.0–46.0)
Hemoglobin: 13.9 g/dL (ref 12.0–15.0)
Potassium: 3.9 mmol/L (ref 3.5–5.1)
Sodium: 138 mmol/L (ref 135–145)
TCO2: 23 mmol/L (ref 22–32)

## 2022-02-07 LAB — ETHANOL: Alcohol, Ethyl (B): <10 mg/dL (ref <10)

## 2022-02-07 LAB — DIFFERENTIAL
Abs Immature Granulocytes: 0.02 10*3/uL (ref 0.00–0.07)
Basophils Absolute: 0 10*3/uL (ref 0.0–0.1)
Basophils Relative: 0 %
Eosinophils Absolute: 0.2 10*3/uL (ref 0.0–0.5)
Eosinophils Relative: 2 %
Immature Granulocytes: 0 %
Lymphocytes Relative: 17 %
Lymphs Abs: 1.4 10*3/uL (ref 0.7–4.0)
Monocytes Absolute: 0.5 10*3/uL (ref 0.1–1.0)
Monocytes Relative: 6 %
Neutro Abs: 6.6 10*3/uL (ref 1.7–7.7)
Neutrophils Relative %: 75 %

## 2022-02-07 LAB — COMPREHENSIVE METABOLIC PANEL
ALT: 23 U/L (ref 0–44)
AST: 24 U/L (ref 15–41)
Albumin: 3.4 g/dL — ABNORMAL LOW (ref 3.5–5.0)
Alkaline Phosphatase: 79 U/L (ref 38–126)
Anion gap: 9 (ref 5–15)
BUN: 9 mg/dL (ref 6–20)
CO2: 24 mmol/L (ref 22–32)
Calcium: 8.8 mg/dL — ABNORMAL LOW (ref 8.9–10.3)
Chloride: 105 mmol/L (ref 98–111)
Creatinine, Ser: 0.76 mg/dL (ref 0.44–1.00)
GFR, Estimated: >60 mL/min (ref >60)
Glucose, Bld: 96 mg/dL (ref 70–99)
Potassium: 4 mmol/L (ref 3.5–5.1)
Sodium: 138 mmol/L (ref 135–145)
Total Bilirubin: 0.4 mg/dL (ref 0.3–1.2)
Total Protein: 6.6 g/dL (ref 6.5–8.1)

## 2022-02-07 LAB — CBC
HCT: 42 % (ref 36.0–46.0)
Hemoglobin: 13.2 g/dL (ref 12.0–15.0)
MCH: 30 pg (ref 26.0–34.0)
MCHC: 31.4 g/dL (ref 30.0–36.0)
MCV: 95.5 fL (ref 80.0–100.0)
Platelets: 259 10*3/uL (ref 150–400)
RBC: 4.4 MIL/uL (ref 3.87–5.11)
RDW: 12.4 % (ref 11.5–15.5)
WBC: 8.7 10*3/uL (ref 4.0–10.5)
nRBC: 0 % (ref 0.0–0.2)

## 2022-02-07 LAB — CBG MONITORING, ED: Glucose-Capillary: 91 mg/dL (ref 70–99)

## 2022-02-07 LAB — PROTIME-INR
INR: 1 (ref 0.8–1.2)
Prothrombin Time: 13.4 seconds (ref 11.4–15.2)

## 2022-02-07 LAB — I-STAT BETA HCG BLOOD, ED (MC, WL, AP ONLY): I-stat hCG, quantitative: <5 m[IU]/mL (ref <5)

## 2022-02-07 LAB — APTT: aPTT: 27 seconds (ref 24–36)

## 2022-02-07 MED ORDER — ACETAMINOPHEN 650 MG RE SUPP: 650.0000 mg | RECTAL | Status: DC | PRN

## 2022-02-07 MED ORDER — IOHEXOL 350 MG/ML SOLN
75.0000 mL | Freq: Once | INTRAVENOUS | Status: AC | PRN
  Administered 2022-02-07: 75 mL via INTRAVENOUS

## 2022-02-07 MED ORDER — SODIUM CHLORIDE 0.9% FLUSH: 3.0000 mL | Freq: Once | INTRAVENOUS | Status: DC

## 2022-02-07 MED ORDER — SENNOSIDES-DOCUSATE SODIUM 8.6-50 MG PO TABS: 1.0000 | ORAL_TABLET | Freq: Every evening | ORAL | Status: DC | PRN

## 2022-02-07 MED ORDER — ACETAMINOPHEN 325 MG PO TABS: 650.0000 mg | ORAL_TABLET | ORAL | Status: DC | PRN

## 2022-02-07 MED ORDER — STROKE: EARLY STAGES OF RECOVERY BOOK
Freq: Once | Status: AC
Start: 2022-02-08 — End: 2022-02-08
  Filled 2022-02-07: qty 1

## 2022-02-07 MED ORDER — ACETAMINOPHEN 160 MG/5ML PO SOLN: 650.0000 mg | ORAL | Status: DC | PRN

## 2022-02-07 MED ORDER — ASPIRIN 81 MG PO TBEC
81.0000 mg | DELAYED_RELEASE_TABLET | Freq: Every day | ORAL | Status: DC
  Administered 2022-02-08 – 2022-02-11 (×4): 81 mg via ORAL
  Filled 2022-02-07 (×4): qty 1

## 2022-02-07 NOTE — Consult Note (Signed)
Neurology consult H&P  CC: Numbness of LUE, Left neck and face   History is obtained from: Patient, chart   HPI: Debbie Armstrong is a 36 y.o. 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 Kirtland Bouchard. de Melchor Amour. She ws neurologically intact without deficits at discharge. Most recently she underwent diagnostic anioggram with D. Melchor Amour 3 days ago on July 6 without difficulty and was discharged home. Today patient was at her baseline until while driving at 1610 she first began to notice numbness/tingling in her left hand which progressed to numbness/tingling in her left face along with lightheadedness over the next 15 minutes which did not resolve thus prompting her to report to ED where Code Stroke was activated. CT revealed right cerebellar acute infarct.   LKW: 1414 today  tpa given?: No, minimal deficit,  IR Thrombectomy? No  Modified Rankin Scale: 0-Completely asymptomatic and back to baseline post- stroke NIHSS: 1  ROS: A complete ROS was performed and is negative except as noted in the HPI.   Past Medical History:  Diagnosis Date   Aneurysm (HCC)      Family History  Problem Relation Age of Onset   Hypertension Mother    Diabetes Son     Social History:  reports that she has never smoked. She has never used smokeless tobacco. She reports that she does not drink alcohol and does not use drugs.   Prior to Admission medications   Medication Sig Start Date End Date Taking? Authorizing Provider  acetaminophen (TYLENOL) 500 MG tablet Take 1,000 mg by mouth every 6 (six) hours as needed for moderate pain.    [provider]  Multiple Vitamins-Minerals (HAIR SKIN & NAILS ADVANCED) TABS Take 1 tablet by mouth daily.    [provider]    Exam: Current vital signs: BP  (!) 172/120   Pulse 73   Temp 98.3 F (36.8 C) (Oral)   Resp 19   SpO2 100%   Physical Exam  Constitutional: Appears well-developed and well-nourished.  Psych: Affect appropriate to situation Eyes: No scleral injection HENT: No OP obstrucion Head: Normocephalic.  Cardiovascular: Normal rate and regular rhythm.  Respiratory: Effort normal and breath sounds normal to anterior ascultation GI: Soft.  No distension. There is no tenderness.  Skin: WDI  Neuro: Mental Status: Patient is awake, alert, oriented to person, place, month, year, and situation. Patient is able to give a clear and coherent history. No signs of aphasia or neglect Speech/Language: Cranial Nerves: II: Visual Fields are full. Pupils are equal, round, and reactive to light.  III,IV, VI: EOMI without ptosis or diploplia.  V: Facial sensation is asymmetric to light touch with decreased sensation on the left  VII: Facial movement is symmetric.  VIII: hearing is intact to voice X: Uvula elevates symmetrically XI: Shoulder shrug is symmetric. XII: tongue is midline without atrophy or fasciculations.  Motor: Tone is normal. Bulk is normal. 5/5 strength was present in all four extremities.  Sensory: Sensation is asymmetric to light touch with deceased sensation in LUE and LLE. Describes as 20% decreased on the left.   Plantars: Toes are downgoing bilaterally.  Cerebellar: FNF and HKS are intact bilaterally.   I have reviewed labs in epic and the pertinent results are: Lab Results  Component Value Date   WBC 8.7 02/07/2022  HGB 13.9 02/07/2022   HCT 41.0 02/07/2022   MCV 95.5 02/07/2022   PLT 259 02/07/2022    Lab Results  Component Value Date   NA 138 02/07/2022   K 3.9 02/07/2022   CO2 24 02/07/2022   GLUCOSE 90 02/07/2022   BUN 9 02/07/2022   CREATININE 0.70 02/07/2022   CALCIUM 8.8 (L) 02/07/2022   GFRNONAA >60 02/07/2022   Lab Results  Component Value Date   INR 1.0 02/07/2022   INR 1.0  02/04/2022   INR 1.1 11/08/2021    Beta hCG negative  Ehthanol less than 10 on 02/07/2022  I have reviewed the images obtained: NCT head showed  1. Probably acute right cerebellar infarct. No significant mass effect. Recommend MRI if the patient is able. 2. No acute hemorrhage.  Primary Diagnosis:  R Cerebellar stroke Recent aneurysmal subarachnoid hemorrhage.  Secondary Diagnosis: None   Impression:   Debbie Armstrong is a 36 y.o. female with PMH significant for aneurysmal SAH on 11/08/2021 due to ACA aneurysm rupture s/p embolization of 3 small anterior communicated artery aneurysms on 11/09/21 with most recent diagnostic angiogram on 7/6 with complete occlusion of previous aneurysms. She presents today with tingling/numbness on the left along with lightheadedness. She was not a candidate for tNKASE or thrombectomy due to low NIHSS and non disabling symptoms.   Code stroke CTH demonstrated a R cerebellar acute stroke which is likely incidental and would not explain her left sided tingling/numbness.  She denies any headaches or other signs of SAH.  Plan:  - Frequent Neuro checks per stroke unit protocol - Recommend brain imaging with MRI Brain without contrast - Recommend Vascular imaging with CT angio head and neck - Recommend obtaining TTE - Recommend obtaining Lipid panel with LDL - Please start statin if LDL > 70 - Recommend HbA1c - Antithrombotic - Aspirin 81mg  daily. - Recommend DVT ppx - SBP goal - permissive hypertension first 24 h < 220/110. Hold home meds.  - Recommend Telemetry monitoring for arrythmia - Recommend bedside swallow screen prior to PO intake. - Stroke education booklet - Recommend PT/OT/SLP consult - Recommend Urine Tox screen.    Plan of care directed by  , NP-C, MSN [facility] Please see AMION for contact information for neurologic providers.   02/07/2022, 5:30 PM    NEUROHOSPITALIST ADDENDUM Performed a face to  face diagnostic evaluation.   I have reviewed the contents of history and physical exam as documented by PA/ARNP/Resident and agree with above documentation.  I have discussed and formulated the above plan as documented. Edits to the note have been made as needed.   04/10/2022, MD [facility] Erick Blinks   If 7pm to 7am, please call on call as listed on AMION.

## 2022-02-07 NOTE — ED Triage Notes (Signed)
Patint developed left sided numbness, face, tongue, left arm and neck at 1415, had coiling of aneurysm in April and per patient no deficits from same. Alert and oriented, code stroke activated

## 2022-02-07 NOTE — Assessment & Plan Note (Signed)
History of SAH on 11/08/2021 due to ACA aneurysm rupture s/p coil embolization of 3 small anterior communicating artery aneurysms by IR, Dr. Tommie Sams.  Was also noted to have right ICA ophthalmic segment aneurysm at the time.  Repeat cerebral angiogram 02/04/2022 showed complete occlusion of the previously coiled small anterior communicating artery aneurysms without evidence of coil compaction.

## 2022-02-07 NOTE — H&P (Addendum)
History and Physical    Debbie Armstrong LKG:[id number] DOB: Nov 21, 1985 DOA: 02/07/2022  PCP: Pcp, No  Patient coming from: Home  I have personally briefly reviewed patient's old medical records in [facility]  Chief Complaint: Left-sided numbness  HPI: Debbie Armstrong is a 36 y.o. 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  She was in hospital until 11/25/2021 at which time she was discharged to home with full recovery, no residual deficits.  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  Patient states she was feeling well until earlier today (02/07/2022) when she noticed new numbness most prominently in her left lower arm and then involving her left lower face and left thigh.  She did not have any associated weakness.  She did feels like she was having palpitations.  She denied any headache, change in vision, chest pain, dyspnea, nausea, vomiting, dizziness, gait instability. She is not currently taking any medications.  ED Course  Labs/Imaging on admission: I have personally reviewed following labs and imaging studies.  Initial vitals showed BP 120/76, pulse 72, RR 16, temp 98.3 F, SPO2 99% on room air.  Labs show sodium 138, potassium 4.0, bicarb 24, BUN 9, creatinine 0.76, serum glucose 96, LFTs within normal limits, WBC 8.7, hemoglobin 13.2, platelets 289,000.  CT head without contrast concerning for probable acute right cerebellar infarct.  No significant mass effect seen.  No acute hemorrhage noted.  Neurology recommended admission for further work-up.  MRI brain ordered and pending.  The  hospitalist service consulted to admit for further evaluation and management.  Review of Systems: All systems reviewed and are negative except as documented in history of present illness above.   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 02/07/2022    Past Surgical History:  Procedure Laterality Date   IR 3D INDEPENDENT WKST  11/08/2021   IR 3D INDEPENDENT WKST  02/04/2022   IR ANGIO INTRA EXTRACRAN SEL INTERNAL CAROTID BILAT MOD SED  11/08/2021   IR ANGIO INTRA EXTRACRAN SEL INTERNAL CAROTID BILAT MOD SED  02/04/2022   IR ANGIO VERTEBRAL SEL VERTEBRAL BILAT MOD SED  02/04/2022   IR ANGIO VERTEBRAL SEL VERTEBRAL UNI L MOD SED  11/08/2021   IR ANGIOGRAM FOLLOW UP STUDY  11/08/2021   IR ANGIOGRAM FOLLOW UP STUDY  11/08/2021   IR ANGIOGRAM FOLLOW UP STUDY  11/08/2021   IR NEURO EACH ADD'L AFTER BASIC UNI RIGHT (MS)  11/08/2021   IR TRANSCATH/EMBOLIZ  11/08/2021   IR US GUIDE VASC ACCESS RIGHT  11/08/2021   IR US GUIDE VASC ACCESS RIGHT  02/04/2022   RADIOLOGY WITH ANESTHESIA N/A 11/08/2021   Procedure: INTERVENTIONAL RADIOLOGY WITH ANESTHESIA;  Surgeon: Radiologist, Medication, MD;  Location: [facility] OR;  Service: Radiology;  Laterality: N/A;    Social History:  reports that she has never smoked. She has never used smokeless tobacco. She reports that she does not drink alcohol and does not use drugs.  No Known Allergies  Family History  Problem Relation Age of Onset   Hypertension Mother    Diabetes Son      Prior to Admission  medications   Medication Sig Start Date End Date Taking? Authorizing Provider  acetaminophen (TYLENOL) 500 MG tablet Take 1,000 mg by mouth every 6 (six) hours as needed for moderate pain.    [provider]  Multiple Vitamins-Minerals (HAIR SKIN & NAILS ADVANCED) TABS Take 1 tablet by mouth daily.    [provider]    Physical Exam: Vitals:   02/07/22 1800  02/07/22 1815 02/07/22 1830 02/07/22 1945  BP: (!) 165/103 136/77 (!) 150/102 (!) 123/112  Pulse: 99 62 73 69  Resp: (!) 22 15 16 19   Temp:      TempSrc:      SpO2: 100% 100% 100% 100%   Constitutional: Resting in bed with head elevated, NAD, calm, comfortable Eyes: EOMI, PERRL, lids and conjunctivae normal ENMT: Mucous membranes are moist. Posterior pharynx clear of any exudate or lesions.Normal dentition.  Neck: normal, supple, no masses. Respiratory: clear to auscultation bilaterally, no wheezing, no crackles. Normal respiratory effort. No accessory muscle use.  Cardiovascular: Regular rate and rhythm, no murmurs / rubs / gallops. No extremity edema. 2+ pedal pulses. Abdomen: no tenderness, no masses palpated.  Musculoskeletal: no clubbing / cyanosis. No joint deformity upper and lower extremities. Good ROM, no contractures. Normal muscle tone.  Skin: no rashes, lesions, ulcers. No induration Neurologic: CN 2-12 grossly intact. Sensation intact. Strength 5/5 in all 4.  Psychiatric: Normal judgment and insight. Alert and oriented x 3. Normal mood.   EKG: Personally reviewed. Normal sinus rhythm, rate 90, without acute ischemic changes.  Rate is faster when compared to prior.  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  Incidental finding of acute right cerebellar infarct on CT head.  Neurology following.  Assessment and Plan: * Left sided numbness New left-sided numbness involving lower face, LUE, left thigh beginning 02/07/2022.  CT head suggestive of acute right cerebellar infarct which would not explain  her presenting symptoms. 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 01/08/2022.  Was also noted to have right ICA ophthalmic segment aneurysm at the time.  Repeat cerebral angiogram 02/04/2022 showed complete occlusion of the previously coiled small anterior communicating artery aneurysms without evidence of coil compaction.  DVT prophylaxis: SCD's Start: 02/07/22 1936 Code Status: Full code Family Communication: Discussed with sister at bedside Disposition Plan: From home and likely discharge to home pending clinical progress Consults called: Neurology Severity of Illness: The appropriate patient status for this patient is OBSERVATION. Observation status is judged to be reasonable and necessary in order to provide the required intensity of service to ensure the patient's safety. The patient's presenting symptoms, physical exam findings, and initial radiographic and laboratory data in the context of their medical condition is felt to place them at decreased risk for further clinical deterioration. Furthermore, it is anticipated that the patient will be medically stable for discharge from the hospital within 2 midnights of admission.  Darreld Mclean MD [facility]  If 7PM-7AM, please contact night-coverage [email or web address]  02/07/2022, 9:01 PM

## 2022-02-07 NOTE — ED Notes (Signed)
[facility] called to activate code stroke per RN Elita Quick request

## 2022-02-07 NOTE — H&P (View-Only) (Signed)
Neurology consult H&P  CC: Numbness of LUE, Left neck and face   History is obtained from: Patient, chart   HPI: Debbie Armstrong is a 36 y.o. 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 K. de Macedo Rodrigues. She ws neurologically intact without deficits at discharge. Most recently she underwent diagnostic anioggram with D. Macedo Rodrigues 3 days ago on July 6 without difficulty and was discharged home. Today patient was at her baseline until while driving at 1415 she first began to notice numbness/tingling in her left hand which progressed to numbness/tingling in her left face along with lightheadedness over the next 15 minutes which did not resolve thus prompting her to report to ED where Code Stroke was activated. CT revealed right cerebellar acute infarct.   LKW: 1414 today  tpa given?: No, minimal deficit,  IR Thrombectomy? No  Modified Rankin Scale: 0-Completely asymptomatic and back to baseline post- stroke NIHSS: 1  ROS: A complete ROS was performed and is negative except as noted in the HPI.   Past Medical History:  Diagnosis Date   Aneurysm (HCC)      Family History  Problem Relation Age of Onset   Hypertension Mother    Diabetes Son     Social History:  reports that she has never smoked. She has never used smokeless tobacco. She reports that she does not drink alcohol and does not use drugs.   Prior to Admission medications   Medication Sig Start Date End Date Taking? Authorizing Provider  acetaminophen (TYLENOL) 500 MG tablet Take 1,000 mg by mouth every 6 (six) hours as needed for moderate pain.    [provider]  Multiple Vitamins-Minerals (HAIR SKIN & NAILS ADVANCED) TABS Take 1 tablet by mouth daily.    [provider]    Exam: Current vital signs: BP  (!) 172/120   Pulse 73   Temp 98.3 F (36.8 C) (Oral)   Resp 19   SpO2 100%   Physical Exam  Constitutional: Appears well-developed and well-nourished.  Psych: Affect appropriate to situation Eyes: No scleral injection HENT: No OP obstrucion Head: Normocephalic.  Cardiovascular: Normal rate and regular rhythm.  Respiratory: Effort normal and breath sounds normal to anterior ascultation GI: Soft.  No distension. There is no tenderness.  Skin: WDI  Neuro: Mental Status: Patient is awake, alert, oriented to person, place, month, year, and situation. Patient is able to give a clear and coherent history. No signs of aphasia or neglect Speech/Language: Cranial Nerves: II: Visual Fields are full. Pupils are equal, round, and reactive to light.  III,IV, VI: EOMI without ptosis or diploplia.  V: Facial sensation is asymmetric to light touch with decreased sensation on the left  VII: Facial movement is symmetric.  VIII: hearing is intact to voice X: Uvula elevates symmetrically XI: Shoulder shrug is symmetric. XII: tongue is midline without atrophy or fasciculations.  Motor: Tone is normal. Bulk is normal. 5/5 strength was present in all four extremities.  Sensory: Sensation is asymmetric to light touch with deceased sensation in LUE and LLE. Describes as 20% decreased on the left.   Plantars: Toes are downgoing bilaterally.  Cerebellar: FNF and HKS are intact bilaterally.   I have reviewed labs in epic and the pertinent results are: Lab Results  Component Value Date   WBC 8.7 02/07/2022     HGB 13.9 02/07/2022   HCT 41.0 02/07/2022   MCV 95.5 02/07/2022   PLT 259 02/07/2022    Lab Results  Component Value Date   NA 138 02/07/2022   K 3.9 02/07/2022   CO2 24 02/07/2022   GLUCOSE 90 02/07/2022   BUN 9 02/07/2022   CREATININE 0.70 02/07/2022   CALCIUM 8.8 (L) 02/07/2022   GFRNONAA >60 02/07/2022   Lab Results  Component Value Date   INR 1.0 02/07/2022   INR 1.0  02/04/2022   INR 1.1 11/08/2021    Beta hCG negative  Ehthanol less than 10 on 02/07/2022  I have reviewed the images obtained: NCT head showed  1. Probably acute right cerebellar infarct. No significant mass effect. Recommend MRI if the patient is able. 2. No acute hemorrhage.  Primary Diagnosis:  R Cerebellar stroke Recent aneurysmal subarachnoid hemorrhage.  Secondary Diagnosis: None   Impression:   Debbie Armstrong is a 36 y.o. female with PMH significant for aneurysmal SAH on 11/08/2021 due to ACA aneurysm rupture s/p embolization of 3 small anterior communicated artery aneurysms on 11/09/21 with most recent diagnostic angiogram on 7/6 with complete occlusion of previous aneurysms. She presents today with tingling/numbness on the left along with lightheadedness. She was not a candidate for tNKASE or thrombectomy due to low NIHSS and non disabling symptoms.   Code stroke CTH demonstrated a R cerebellar acute stroke which is likely incidental and would not explain her left sided tingling/numbness.  She denies any headaches or other signs of SAH.  Plan:  - Frequent Neuro checks per stroke unit protocol - Recommend brain imaging with MRI Brain without contrast - Recommend Vascular imaging with CT angio head and neck - Recommend obtaining TTE - Recommend obtaining Lipid panel with LDL - Please start statin if LDL > 70 - Recommend HbA1c - Antithrombotic - Aspirin 81mg  daily. - Recommend DVT ppx - SBP goal - permissive hypertension first 24 h < 220/110. Hold home meds.  - Recommend Telemetry monitoring for arrythmia - Recommend bedside swallow screen prior to PO intake. - Stroke education booklet - Recommend PT/OT/SLP consult - Recommend Urine Tox screen.    Plan of care directed by  , NP-C, MSN [facility] Please see AMION for contact information for neurologic providers.   02/07/2022, 5:30 PM    NEUROHOSPITALIST ADDENDUM Performed a face to  face diagnostic evaluation.   I have reviewed the contents of history and physical exam as documented by PA/ARNP/Resident and agree with above documentation.  I have discussed and formulated the above plan as documented. Edits to the note have been made as needed.   04/10/2022, MD [facility] Erick Blinks   If 7pm to 7am, please call on call as listed on AMION.

## 2022-02-07 NOTE — ED Notes (Signed)
Patient transported to MRI 

## 2022-02-07 NOTE — Assessment & Plan Note (Addendum)
New left-sided numbness involving lower face, LUE, left thigh beginning 02/07/2022.  CT head suggestive of acute right cerebellar infarct which would not explain her presenting symptoms. -Neurology consulting -MRI brain pending -CTA head/neck -Echocardiogram -Aspirin 81 mg per neurology -Check A1c, lipid panel -PT/OT/SLP -Allow permissive hypertension

## 2022-02-07 NOTE — ED Provider Notes (Signed)
[facility] EMERGENCY DEPARTMENT Provider Note   CSN: [id number] Arrival date & time: 02/07/22  1637     History  Chief Complaint  Patient presents with   Code Stroke    Debbie Armstrong is a 36 y.o. female.  HPI Patient with a notable history of recent subarachnoid bleed presents with new left-sided numbness.  Onset was 2:15 PM today, 3 hours prior to ED arrival.  Onset was abrupt, with symptoms that improved mildly prior to ED arrival.  Patient is subsequently joined by family numbers to assist with the history.  She notes that she had been doing generally well after recent CT angiography of her recent coiled aneurysm.   Code stroke activated on arrival, patient evaluated with our neurology colleagues.    Home Medications Prior to Admission medications   Medication Sig Start Date End Date Taking? Authorizing Provider  acetaminophen (TYLENOL) 500 MG tablet Take 1,000 mg by mouth every 6 (six) hours as needed for moderate pain.    [provider]  Multiple Vitamins-Minerals (HAIR SKIN & NAILS ADVANCED) TABS Take 1 tablet by mouth daily.    [provider]      Allergies    Patient has no known allergies.    Review of Systems   Review of Systems  All other systems reviewed and are negative.   Physical Exam Updated Vital Signs BP (!) 152/100   Pulse 73   Temp 98.3 F (36.8 C) (Oral)   Resp 17   SpO2 100%  Physical Exam Vitals and nursing note reviewed.  Constitutional:      General: She is not in acute distress.    Appearance: She is well-developed.  HENT:     Head: Normocephalic and atraumatic.  Eyes:     Conjunctiva/sclera: Conjunctivae normal.  Cardiovascular:     Rate and Rhythm: Normal rate and regular rhythm.  Pulmonary:     Effort: Pulmonary effort is normal. No respiratory distress.     Breath sounds: Normal breath sounds. No stridor.  Abdominal:     General: There is no distension.  Skin:    General: Skin is warm and  dry.  Neurological:     Mental Status: She is alert and oriented to person, place, and time.     Cranial Nerves: No cranial nerve deficit or facial asymmetry.     Motor: No weakness, tremor, atrophy or abnormal muscle tone.     Comments: Patient notes decrease sensation in the left upper extremity, but objectively has preserved sensation throughout.  Psychiatric:        Mood and Affect: Mood normal.    ED Results / Procedures / Treatments   Labs (all labs ordered are listed, but only abnormal results are displayed) Labs Reviewed  COMPREHENSIVE METABOLIC PANEL - Abnormal; Notable for the following components:      Result Value   Calcium 8.8 (*)    Albumin 3.4 (*)    All other components within normal limits  I-STAT CHEM 8, ED - Abnormal; Notable for the following components:   Calcium, Ion 1.12 (*)    All other components within normal limits  PROTIME-INR  APTT  CBC  DIFFERENTIAL  ETHANOL  RAPID URINE DRUG SCREEN, HOSP PERFORMED  CBG MONITORING, ED  I-STAT BETA HCG BLOOD, ED ([facility], [facility], [facility] ONLY)    EKG None  Radiology CT HEAD CODE STROKE WO CONTRAST  Result Date: 02/07/2022 CLINICAL DATA:  Code stroke.  Neuro deficit, acute, stroke suspected EXAM: CT  HEAD WITHOUT CONTRAST TECHNIQUE: Contiguous axial images were obtained from the base of the skull through the vertex without intravenous contrast. RADIATION DOSE REDUCTION: This exam was performed according to the departmental dose-optimization program which includes automated exposure control, adjustment of the mA and/or kV according to patient size and/or use of iterative reconstruction technique. COMPARISON:  CT head November 09, 2021. FINDINGS: Brain: Probably acute right cerebellar infarct with hypoattenuation loss of gray differentiation. No significant mass effect. Vascular: Aneurysm coiling in the region of the anterior communicating artery. Streak limits surrounding area. No hyperdense vessel identified. Skull: No acute fracture.  Sinuses/Orbits: Clear sinuses.  No acute orbital findings. Other: No mastoid effusions. IMPRESSION: 1. Probably acute right cerebellar infarct. No significant mass effect. Recommend MRI if the patient is able. 2. No acute hemorrhage. Findings discussed with Dr. Ezzie Dural via telephone at 5:18 PM. Electronically Signed   By: Feliberto Harts M.D.   On: 02/07/2022 17:21    Procedures Procedures    Medications Ordered in ED Medications  sodium chloride flush (NS) 0.9 % injection 3 mL (0 mLs Intravenous Hold 02/07/22 1719)    ED Course/ Medical Decision Making/ A&P This patient with a Hx of subarachnoid hemorrhage secondary to aneurysmal bleed, with recent coiling presents to the ED for concern of new numbness left-sided, this involves an extensive number of treatment options, and is a complaint that carries with it a high risk of complications and morbidity.    The differential diagnosis includes stroke, aneurysm rupture, coil dysfunction, electrolyte abnormalities, neuropathy   Social Determinants of Health:  No limitations  Additional history obtained:  Additional history and/or information obtained from children at bedside, notable for HPI as above.  Son was with her when symptoms began.   After the initial evaluation, orders, including: Patient was a code stroke so CT x-ray labs were initiated.   Patient placed on Cardiac and Pulse-Oximetry Monitors. The patient was maintained on a cardiac monitor.  The cardiac monitored showed an rhythm of 70 sinus normal The patient was also maintained on pulse oximetry. The readings were typically 100% room air normal   On repeat evaluation of the patient improved Increasing numbness on repeat exam Lab Tests:  I personally interpreted labs.  The pertinent results include: Essentially unremarkable  Imaging Studies ordered:  I independently visualized and interpreted imaging which showed serum Beller stroke, left-sided I agree with the  radiologist interpretation  Consultations Obtained:  I requested consultation with the neurology team who comanaged patient.  Recommendation is for MRI, admission for further monitoring, management, stroke.  Dispostion / Final MDM:  After consideration of the diagnostic results and the patient's response to treatment, this adult female with notable history of recent intracranial hemorrhage now presents with new facial numbness.  Patient is found to have evidence for cerebellar stroke, though in an unusual location for the patient's symptoms today.  Patient improved clinically, had mild hypertension which requires additional monitoring, management, and with concern for acute stroke was admitted for this, as well as additional stroke evaluation, including MRI.  Final Clinical Impression(s) / ED Diagnoses Final diagnoses:  Acute CVA (cerebrovascular accident) (HCC)  Numbness     Gerhard Munch, MD 02/07/22 1805

## 2022-02-07 NOTE — Hospital Course (Signed)
Debbie Armstrong is Debbie Armstrong 36 y.o. 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  Incidental finding of acute right cerebellar infarct on CT head.  Neurology following.

## 2022-02-08 ENCOUNTER — Observation Stay (HOSPITAL_COMMUNITY): Payer: BC Managed Care – PPO

## 2022-02-08 ENCOUNTER — Observation Stay (HOSPITAL_BASED_OUTPATIENT_CLINIC_OR_DEPARTMENT_OTHER): Payer: BC Managed Care – PPO

## 2022-02-08 DIAGNOSIS — Z8679 Personal history of other diseases of the circulatory system: Secondary | ICD-10-CM | POA: Diagnosis not present

## 2022-02-08 DIAGNOSIS — I6340 Cerebral infarction due to embolism of unspecified cerebral artery: Secondary | ICD-10-CM | POA: Diagnosis not present

## 2022-02-08 DIAGNOSIS — I639 Cerebral infarction, unspecified: Secondary | ICD-10-CM | POA: Diagnosis not present

## 2022-02-08 DIAGNOSIS — G43109 Migraine with aura, not intractable, without status migrainosus: Secondary | ICD-10-CM | POA: Diagnosis not present

## 2022-02-08 DIAGNOSIS — Z8673 Personal history of transient ischemic attack (TIA), and cerebral infarction without residual deficits: Secondary | ICD-10-CM | POA: Diagnosis not present

## 2022-02-08 DIAGNOSIS — I6389 Other cerebral infarction: Secondary | ICD-10-CM | POA: Diagnosis not present

## 2022-02-08 DIAGNOSIS — I97821 Postprocedural cerebrovascular infarction during other surgery: Secondary | ICD-10-CM | POA: Diagnosis not present

## 2022-02-08 DIAGNOSIS — T508X5A Adverse effect of diagnostic agents, initial encounter: Secondary | ICD-10-CM

## 2022-02-08 DIAGNOSIS — E785 Hyperlipidemia, unspecified: Secondary | ICD-10-CM | POA: Diagnosis present

## 2022-02-08 DIAGNOSIS — R21 Rash and other nonspecific skin eruption: Secondary | ICD-10-CM | POA: Diagnosis not present

## 2022-02-08 DIAGNOSIS — Q2112 Patent foramen ovale: Secondary | ICD-10-CM

## 2022-02-08 DIAGNOSIS — E669 Obesity, unspecified: Secondary | ICD-10-CM | POA: Diagnosis present

## 2022-02-08 DIAGNOSIS — R200 Anesthesia of skin: Secondary | ICD-10-CM | POA: Diagnosis present

## 2022-02-08 DIAGNOSIS — R29701 NIHSS score 1: Secondary | ICD-10-CM | POA: Diagnosis present

## 2022-02-08 DIAGNOSIS — Z833 Family history of diabetes mellitus: Secondary | ICD-10-CM | POA: Diagnosis not present

## 2022-02-08 DIAGNOSIS — I63541 Cerebral infarction due to unspecified occlusion or stenosis of right cerebellar artery: Secondary | ICD-10-CM | POA: Diagnosis present

## 2022-02-08 DIAGNOSIS — Z888 Allergy status to other drugs, medicaments and biological substances status: Secondary | ICD-10-CM | POA: Diagnosis not present

## 2022-02-08 DIAGNOSIS — Z8249 Family history of ischemic heart disease and other diseases of the circulatory system: Secondary | ICD-10-CM | POA: Diagnosis not present

## 2022-02-08 DIAGNOSIS — Z6833 Body mass index (BMI) 33.0-33.9, adult: Secondary | ICD-10-CM | POA: Diagnosis not present

## 2022-02-08 LAB — CBC
HCT: 43.1 % (ref 36.0–46.0)
Hemoglobin: 13.8 g/dL (ref 12.0–15.0)
MCH: 30.5 pg (ref 26.0–34.0)
MCHC: 32 g/dL (ref 30.0–36.0)
MCV: 95.1 fL (ref 80.0–100.0)
Platelets: 271 10*3/uL (ref 150–400)
RBC: 4.53 MIL/uL (ref 3.87–5.11)
RDW: 12.5 % (ref 11.5–15.5)
WBC: 10 10*3/uL (ref 4.0–10.5)
nRBC: 0 % (ref 0.0–0.2)

## 2022-02-08 LAB — URINALYSIS, ROUTINE W REFLEX MICROSCOPIC
Bilirubin Urine: NEGATIVE
Glucose, UA: NEGATIVE mg/dL
Hgb urine dipstick: NEGATIVE
Ketones, ur: NEGATIVE mg/dL
Leukocytes,Ua: NEGATIVE
Nitrite: NEGATIVE
Protein, ur: NEGATIVE mg/dL
Specific Gravity, Urine: 1.025 (ref 1.005–1.030)
pH: 5.5 (ref 5.0–8.0)

## 2022-02-08 LAB — BASIC METABOLIC PANEL
Anion gap: 9 (ref 5–15)
BUN: 12 mg/dL (ref 6–20)
CO2: 24 mmol/L (ref 22–32)
Calcium: 9 mg/dL (ref 8.9–10.3)
Chloride: 107 mmol/L (ref 98–111)
Creatinine, Ser: 0.87 mg/dL (ref 0.44–1.00)
GFR, Estimated: >60 mL/min (ref >60)
Glucose, Bld: 102 mg/dL — ABNORMAL HIGH (ref 70–99)
Potassium: 4.3 mmol/L (ref 3.5–5.1)
Sodium: 140 mmol/L (ref 135–145)

## 2022-02-08 LAB — LIPID PANEL
Cholesterol: 151 mg/dL (ref 0–200)
HDL: 52 mg/dL (ref >40)
LDL Cholesterol: 92 mg/dL (ref 0–99)
Total CHOL/HDL Ratio: 2.9 RATIO
Triglycerides: 37 mg/dL (ref <150)
VLDL: 7 mg/dL (ref 0–40)

## 2022-02-08 LAB — RAPID URINE DRUG SCREEN, HOSP PERFORMED
Amphetamines: NOT DETECTED
Barbiturates: NOT DETECTED
Benzodiazepines: NOT DETECTED
Cocaine: NOT DETECTED
Opiates: NOT DETECTED
Tetrahydrocannabinol: NOT DETECTED

## 2022-02-08 LAB — ECHOCARDIOGRAM COMPLETE BUBBLE STUDY
Area-P 1/2: 2.74 cm2
Height: 66 in
S' Lateral: 2.9 cm
Weight: 3296 oz

## 2022-02-08 LAB — HEMOGLOBIN A1C
Hgb A1c MFr Bld: 4.9 % (ref 4.8–5.6)
Mean Plasma Glucose: 93.93 mg/dL

## 2022-02-08 LAB — ANTITHROMBIN III: AntiThromb III Func: 110 % (ref 75–120)

## 2022-02-08 MED ORDER — ATORVASTATIN CALCIUM 40 MG PO TABS
40.0000 mg | ORAL_TABLET | Freq: Every day | ORAL | Status: DC
  Administered 2022-02-08 – 2022-02-10 (×3): 40 mg via ORAL
  Filled 2022-02-08 (×3): qty 1

## 2022-02-08 MED ORDER — CLOPIDOGREL BISULFATE 75 MG PO TABS
75.0000 mg | ORAL_TABLET | Freq: Every day | ORAL | Status: DC
  Administered 2022-02-09 – 2022-02-10 (×2): 75 mg via ORAL
  Filled 2022-02-08 (×2): qty 1

## 2022-02-08 MED ORDER — ONDANSETRON HCL 4 MG/2ML IJ SOLN
INTRAMUSCULAR | Status: AC
  Filled 2022-02-08: qty 2

## 2022-02-08 MED ORDER — DIPHENHYDRAMINE HCL 25 MG PO CAPS
25.0000 mg | ORAL_CAPSULE | Freq: Once | ORAL | Status: AC | PRN
  Administered 2022-02-08: 25 mg via ORAL
  Filled 2022-02-08: qty 1

## 2022-02-08 MED ORDER — ONDANSETRON HCL 4 MG/2ML IJ SOLN
4.0000 mg | Freq: Four times a day (QID) | INTRAMUSCULAR | Status: DC | PRN
Start: 2022-02-08 — End: 2022-02-11
  Administered 2022-02-08: 4 mg via INTRAVENOUS

## 2022-02-08 MED ORDER — DIPHENHYDRAMINE HCL 25 MG PO CAPS
25.0000 mg | ORAL_CAPSULE | Freq: Four times a day (QID) | ORAL | Status: DC | PRN
  Administered 2022-02-08 – 2022-02-09 (×2): 25 mg via ORAL
  Filled 2022-02-08 (×2): qty 1

## 2022-02-08 NOTE — Assessment & Plan Note (Signed)
Noted  

## 2022-02-08 NOTE — ED Notes (Signed)
Pt notified this RN that she felt like she was itching all over, minimal hives on arms at this time. Pt states that this happened this week after her angio and she thought it was from the contrast. Pt states now that she had the contrast again today she thinks she is allergic to it. No hypoxia or airway distress/swelling at this time. MD made aware.

## 2022-02-08 NOTE — Progress Notes (Signed)
OT Cancellation Note  Patient Details Name: Debbie Armstrong MRN: [id number] DOB: 1986-03-06   Cancelled Treatment:    Reason Eval/Treat Not Completed: Medical issues which prohibited therapy (Nausea and MD ordering nausea meds. Will wait till meds are administered.)  Mathis Cashman M Lynda Wanninger Jlyn Bracamonte MSOT, OTR/L Acute Rehab Office: [phone] 02/08/2022, 8:50 AM

## 2022-02-08 NOTE — Evaluation (Signed)
Occupational Therapy Evaluation Patient Details Name: Debbie Armstrong MRN: [id number] DOB: [birth year] Today's Date: 02/08/2022   History of Present Illness 36 yo female presenting to ED on 7/9 with L side numbness. MRI showing acute R cerebellar infarcts with additional punctate infarcts in the L cerebellum and lateral L temporal lobe. 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   Clinical Impression   PTA, pt was living with her children and was independent. Pt currently presenting at Independent level near baseline and denies any further numbness at LUE and other symptoms. WFL for strength, balance, and cognition. Recommend dc to home once medically stable per physician. All acute OT needs met and will sign off.     Vitals  Patient Position (if appropriate) Orthostatic Vitals  Orthostatic Lying   BP- Lying 120/64  Orthostatic Sitting  BP- Sitting 130/73  Orthostatic Standing at 0 minutes  BP- Standing at 0 minutes 135/83  Orthostatic Standing at 3 minutes  BP- Standing at 3 minutes 122/60             Recommendations for follow up therapy are one component of a multi-disciplinary discharge planning process, led by the attending physician.  Recommendations may be updated based on patient status, additional functional criteria and insurance authorization.   Follow Up Recommendations  No OT follow up    Assistance Recommended at Discharge PRN  Patient can return home with the following      Functional Status Assessment     Equipment Recommendations  None recommended by OT    Recommendations for Other Services       Precautions / Restrictions Precautions Precautions: None Restrictions Weight Bearing Restrictions: No      Mobility Bed Mobility Overal bed mobility: Independent                  Transfers Overall transfer level: Independent                         Balance Overall balance assessment: Independent                                         ADL either performed or assessed with clinical judgement   ADL Overall ADL's : Independent                                             Vision Baseline Vision/History: 1 Wears glasses Patient Visual Report: No change from baseline       Perception     Praxis      Pertinent Vitals/Pain Pain Assessment Pain Assessment: No/denies pain     Hand Dominance Right   Extremity/Trunk Assessment Upper Extremity Assessment Upper Extremity Assessment: Overall WFL for tasks assessed   Lower Extremity Assessment Lower Extremity Assessment: Overall WFL for tasks assessed   Cervical / Trunk Assessment Cervical / Trunk Assessment: Normal   Communication Communication Communication: No difficulties   Cognition Arousal/Alertness: Awake/alert Behavior During Therapy: WFL for tasks assessed/performed Overall Cognitive Status: Within Functional Limits for tasks assessed  General Comments: Navitgating hallway back to her room. Able to name three animals that start with "C". Able answer simple money management question     General Comments  BP stable throughout. Sister present    Exercises     Shoulder Instructions      Home Living Family/patient expects to be discharged to:: Private residence Living Arrangements: Children (Son and daughter) Available Help at Discharge: Family;Available PRN/intermittently Type of Home: Apartment (Second floor) Home Access: Stairs to enter [employer] of Steps: Flight Entrance Stairs-Rails: Left Home Layout: One level     Bathroom Shower/Tub: Teacher, early years/pre: Standard     Home Equipment: None          Prior Functioning/Environment Prior Level of Function : Independent/Modified Independent;Working/employed;Driving                ADLs Comments: Works in [employer], [employer] Problem List: Decreased knowledge of precautions;Impaired UE functional use;Impaired sensation      OT Treatment/Interventions:      OT Goals(Current goals can be found in the care plan section) Acute Rehab OT Goals Patient Stated Goal: Go home OT Goal Formulation: All assessment and education complete, DC therapy  OT Frequency:      Co-evaluation              AM-PAC OT "6 Clicks" Daily Activity     Outcome Measure Help from another person eating meals?: None Help from another person taking care of personal grooming?: None Help from another person toileting, which includes using toliet, bedpan, or urinal?: None Help from another person bathing (including washing, rinsing, drying)?: None Help from another person to put on and taking off regular upper body clothing?: None Help from another person to put on and taking off regular lower body clothing?: None 6 Click Score: 24   End of Session Nurse Communication: Mobility status  Activity Tolerance: Patient tolerated treatment well Patient left: in bed;with call bell/phone within reach;with family/visitor present  OT Visit Diagnosis: Muscle weakness (generalized) (M62.81)                Time: 1505-6979 OT Time Calculation (min): 16 min Charges:  OT General Charges $OT Visit: 1 Visit OT Evaluation $OT Eval Low Complexity: 1 Low  Jenavive Lamboy MSOT, OTR/L Acute Rehab Office: [place] 02/08/2022, 11:14 AM

## 2022-02-08 NOTE — Progress Notes (Addendum)
PROGRESS NOTE    Debbie Armstrong  OIZ:[id number] DOB: [birth year] DOA: 02/07/2022 PCP: Pcp, No  Chief Complaint  Patient presents with   Code Stroke    Brief Narrative:  Debbie Armstrong is Debbie Armstrong 36 y.o. 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  Incidental finding of acute right cerebellar infarct on CT head.  MRI also with strokes.  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   MRI brain with multiple acute R cerebellar infarcts, additional punctate acute infarcts in L cerebellum and lateral L temporal lobe, mild edema without mass effect.  Probable prior small white matter infarcts in L corona radiata.  CTA head/neck without emergent LVO or high grade stenosis, s/p coiling of dominant portion of Debbie Armstrong, unchanged 3 mm Armstrong at the L A1/Debbie Armstrong and 2 mm fusiform Debbie Armstrong Stroke related to recent angiogram? Needs comprehensive w/u given age.  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 Tommie Armstrong.  Was also noted to have right ICA ophthalmic segment Armstrong at the time.   Repeat cerebral angiogram 02/04/2022 showed complete occlusion of the previously coiled small anterior communicating artery aneurysms without evidence of coil compaction.  Contrast media adverse reaction Developed rash, hives after contrast Added to allergy list Antihistamine prn No indication for epi at this time  Obesity (BMI 30-39.9) Noted          DVT prophylaxis: SCD Code Status: full Family Communication: sister at bedside Disposition:   Status is: Observation The patient will require care spanning > 2 midnights and should be moved to inpatient because: pending stroke workup completion.  Important that her workup is done inpatient given age, positive bubble study, multiple infarcts on imaging.  Discussed with neurology who recommended TEE be completed inpatient given her age.   Consultants:  neurology  Procedures:  IMPRESSIONS     1. Left ventricular ejection fraction, by estimation, is 60 to 65%. The  left ventricle has normal function. The left ventricle has no regional  wall motion abnormalities. Left ventricular diastolic parameters were  normal.   2. Right ventricular systolic function is normal. The right ventricular  size is normal. There is normal pulmonary artery systolic pressure.   3. The mitral valve is normal in structure. No evidence of mitral valve  regurgitation.   4. The aortic valve is tricuspid. Aortic valve regurgitation is not  visualized. No aortic stenosis is present.   5. The inferior vena cava is normal in size with greater than 50%  respiratory variability, suggesting right atrial pressure of 3 mmHg.   6. Agitated saline contrast bubble study was positive with shunting  observed after >6 cardiac cycles suggestive of intrapulmonary shunting.  Summary:  BILATERAL:  - No evidence of deep vein thrombosis seen in the lower extremities,  bilaterally.  -No evidence of popliteal cyst, bilaterally  Antimicrobials:  Anti-infectives (From  admission, onward)    None       Subjective: No complaints this morning  Objective: Vitals:   02/08/22 1000 02/08/22 1106 02/08/22 1200 02/08/22 1222  BP: 120/69 (!) 142/62 130/74   Pulse: 89 79 68   Resp: (!) 22 16 11    Temp:    98.7 F (37.1 C)  TempSrc:      SpO2: 100% 100% 100%   Weight:      Height:       No intake or output data in the 24 hours ending 02/08/22 1254 Filed Weights   02/08/22 0813  Weight: 93.4 kg    Examination:  General exam: Appears calm and comfortable  Respiratory system: unlabored Cardiovascular system: RRR Gastrointestinal system: Abdomen is nondistended, soft and nontender. Central nervous system: Alert and oriented. No focal neurological deficits. Extremities: no LEE    Data Reviewed: I have personally reviewed following labs and imaging studies  CBC: Recent Labs  Lab 02/04/22 0726 02/07/22 1704 02/07/22 1710 02/08/22 0459  WBC 6.2 8.7  --  10.0  NEUTROABS  --  6.6  --   --   HGB 12.6 13.2 13.9 13.8  HCT 39.5 42.0 41.0 43.1  MCV 93.6 95.5  --  95.1  PLT 264 259  --  [id number]    Basic Metabolic Panel: Recent Labs  Lab 02/04/22 0726 02/07/22 1704 02/07/22 1710 02/08/22 0459  NA 140 138 138 140  K 4.0 4.0 3.9 4.3  CL 110 105 104 107  CO2 24 24  --  24  GLUCOSE 93 96 90 102*  BUN 20 9 9 12   CREATININE 0.85 0.76 0.70 0.87  CALCIUM 8.8* 8.8*  --  9.0    GFR: Estimated Creatinine Clearance: 102.9 mL/min (by C-G formula based on SCr of 0.87 mg/dL).  Liver Function Tests: Recent Labs  Lab 02/07/22 1704  AST 24  ALT 23  ALKPHOS 79  BILITOT 0.4  PROT 6.6  ALBUMIN 3.4*    CBG: Recent Labs  Lab 02/07/22 1703  GLUCAP 91     No results found for this or any previous visit (from the past 240 hour(s)).       Radiology Studies: ECHOCARDIOGRAM COMPLETE BUBBLE STUDY  Result Date: 02/08/2022    ECHOCARDIOGRAM REPORT   Patient Name:   Debbie Armstrong Date of Exam: 02/08/2022 Medical Rec #:  [id number]   Height:        66.0 in Accession #:    [id number]  Weight:       206.0 lb Date of Birth:  [birth year]   BSA:          2.025 m Patient Age:    43 years    BP:           107/39 mmHg Patient Gender: F           HR:           70 bpm. Exam Location:  Inpatient Procedure: 2D Echo, Color Doppler, Cardiac Doppler and Saline Contrast Bubble            Study Indications:    Stroke  History:        Patient has no prior history of Echocardiogram examinations.  Sonographer:    Danne Baxter RDCS, FE, PE Referring Phys: [place]  IMPRESSIONS  1. Left ventricular ejection fraction, by estimation, is 60 to 65%. The left ventricle has normal function. The left ventricle has no regional wall motion abnormalities. Left ventricular diastolic parameters were normal.  2. Right ventricular systolic function is normal. The right ventricular size is normal. There is normal pulmonary artery systolic pressure.  3. The mitral valve is normal in structure. No evidence of mitral valve regurgitation.  4. The aortic valve is tricuspid. Aortic valve regurgitation is not visualized. No aortic stenosis is present.  5. The inferior vena cava is normal in size with greater than 50% respiratory variability, suggesting right atrial pressure of 3 mmHg.  6. Agitated saline contrast bubble study was positive with shunting observed after >6 cardiac cycles suggestive of intrapulmonary shunting. FINDINGS  Left Ventricle: Left ventricular ejection fraction, by estimation, is 60 to 65%. The left ventricle has normal function. The left ventricle has no regional wall motion abnormalities. The left ventricular internal cavity size was normal in size. There is  no left ventricular hypertrophy. Left ventricular diastolic parameters were normal. Right Ventricle: The right ventricular size is normal. No increase in right ventricular wall thickness. Right ventricular systolic function is normal. There is normal pulmonary artery systolic pressure. The tricuspid  regurgitant velocity is 1.64 m/s, and  with an assumed right atrial pressure of 3 mmHg, the estimated right ventricular systolic pressure is [id number] mmHg. Left Atrium: Left atrial size was normal in size. Right Atrium: Right atrial size was normal in size. Pericardium: There is no evidence of pericardial effusion. Mitral Valve: The mitral valve is normal in structure. No evidence of mitral valve regurgitation. Tricuspid Valve: The tricuspid valve is normal in structure. Tricuspid valve regurgitation is trivial. Aortic Valve: The aortic valve is tricuspid. Aortic valve regurgitation is not visualized. No aortic stenosis is present. Pulmonic Valve: The pulmonic valve was grossly normal. Pulmonic valve regurgitation is trivial. Aorta: The aortic root and ascending aorta are structurally normal, with no evidence of dilitation. Venous: The inferior vena cava is normal in size with greater than 50% respiratory variability, suggesting right atrial pressure of 3 mmHg. IAS/Shunts: The interatrial septum was not well visualized. Agitated saline contrast was given intravenously to evaluate for intracardiac shunting. Agitated saline contrast bubble study was positive with shunting observed after >6 cardiac cycles suggestive of intrapulmonary shunting.  LEFT VENTRICLE PLAX 2D LVIDd:         4.60 cm   Diastology LVIDs:         2.90 cm   LV e' medial:    11.30 cm/s LV PW:         0.90 cm   LV E/e' medial:  5.8 LV IVS:        0.80 cm   LV e' lateral:   13.50 cm/s LVOT diam:     1.90 cm   LV E/e' lateral: 4.8 LV SV:         52 LV SV Index:   26 LVOT Area:     2.84 cm  RIGHT VENTRICLE RV S prime:     12.80 cm/s TAPSE (M-mode): 2.0 cm LEFT ATRIUM             Index        RIGHT ATRIUM           Index LA diam:        2.70 cm 1.33 cm/m   RA Area:     11.40 cm LA Vol (A2C):   25.6 ml 12.64 ml/m  RA Volume:   24.80 ml  12.25 ml/m LA Vol (A4C):   29.9 ml 14.76 ml/m LA Biplane Vol: 27.7 ml 13.68 ml/m  AORTIC VALVE LVOT Vmax:   95.80 cm/s  LVOT Vmean:  68.500 cm/s LVOT VTI:    0.183 m  AORTA Ao Root diam: 2.80 cm Ao Asc diam:  2.60 cm MITRAL VALVE               TRICUSPID VALVE MV Area (PHT): 2.74 cm    TR Peak grad:   10.8 mmHg MV Decel Time: 277 msec    TR Vmax:        164.00 cm/s MV E velocity: 65.00 cm/s MV Nalah Macioce velocity: 66.60 cm/s  SHUNTS MV E/Gareth Fitzner ratio:  0.98        Systemic VTI:  0.18 m                            Systemic Diam: 1.90 cm Epifanio Lesches MD Electronically signed by Epifanio Lesches MD Signature Date/Time: 02/08/2022/12:07:40 PM    Final    VAS [place] LOWER EXTREMITY VENOUS (DVT)  Result Date: 02/08/2022  Lower Venous DVT Study Patient Name:  SATIN BOAL  Date of Exam:   02/08/2022 Medical Rec #: [id number]    Accession #:    [id number] Date of Birth: 05-14-1986    Patient Gender: F Patient Age:   30 years Exam Location:  [facility] Procedure:      VAS [place] LOWER EXTREMITY VENOUS (DVT) Referring Phys: Karesa Maultsby POWELL JR --------------------------------------------------------------------------------  Indications: Stroke.  Comparison Study: No previous exam noted. Performing Technologist: Magdalene River BS, RVT  Examination Guidelines: Romey Cohea complete evaluation includes B-mode imaging, spectral Doppler, color Doppler, and power Doppler as needed of all accessible portions of each vessel. Bilateral testing is considered an integral part of Saralyn Willison complete examination. Limited examinations for reoccurring indications may be performed as noted. The reflux portion of the exam is performed with the patient in reverse Trendelenburg.  +---------+---------------+---------+-----------+----------+--------------+ RIGHT    CompressibilityPhasicitySpontaneityPropertiesThrombus Aging +---------+---------------+---------+-----------+----------+--------------+ CFV      Full           Yes      Yes                                 +---------+---------------+---------+-----------+----------+--------------+ SFJ      Full                                                         +---------+---------------+---------+-----------+----------+--------------+ FV Prox  Full                                                        +---------+---------------+---------+-----------+----------+--------------+ FV Mid   Full                                                        +---------+---------------+---------+-----------+----------+--------------+ FV  DistalFull                                                        +---------+---------------+---------+-----------+----------+--------------+ PFV      Full                                                        +---------+---------------+---------+-----------+----------+--------------+ POP      Full           Yes      Yes                                 +---------+---------------+---------+-----------+----------+--------------+ PTV      Full                                                        +---------+---------------+---------+-----------+----------+--------------+ PERO     Full                                                        +---------+---------------+---------+-----------+----------+--------------+   +---------+---------------+---------+-----------+----------+--------------+ LEFT     CompressibilityPhasicitySpontaneityPropertiesThrombus Aging +---------+---------------+---------+-----------+----------+--------------+ CFV      Full           Yes      Yes                                 +---------+---------------+---------+-----------+----------+--------------+ SFJ      Full                                                        +---------+---------------+---------+-----------+----------+--------------+ FV Prox  Full                                                        +---------+---------------+---------+-----------+----------+--------------+ FV Mid   Full                                                         +---------+---------------+---------+-----------+----------+--------------+ FV DistalFull                                                        +---------+---------------+---------+-----------+----------+--------------+  PFV      Full                                                        +---------+---------------+---------+-----------+----------+--------------+ POP      Full           Yes      Yes                                 +---------+---------------+---------+-----------+----------+--------------+ PTV      Full                                                        +---------+---------------+---------+-----------+----------+--------------+ PERO     Full                                                        +---------+---------------+---------+-----------+----------+--------------+     Summary: BILATERAL: - No evidence of deep vein thrombosis seen in the lower extremities, bilaterally. -No evidence of popliteal cyst, bilaterally.   *See table(s) above for measurements and observations.    Preliminary    CT ANGIO HEAD NECK W WO CM (CODE STROKE)  Result Date: 02/07/2022 CLINICAL DATA:  Left-sided numbness. Status post coil embolization of anterior communicating artery Armstrong. EXAM: CT ANGIOGRAPHY HEAD AND NECK TECHNIQUE: Multidetector CT imaging of the head and neck was performed using the standard protocol during bolus administration of intravenous contrast. Multiplanar CT image reconstructions and MIPs were obtained to evaluate the vascular anatomy. Carotid stenosis measurements (when applicable) are obtained utilizing NASCET criteria, using the distal internal carotid diameter as the denominator. RADIATION DOSE REDUCTION: This exam was performed according to the departmental dose-optimization program which includes automated exposure control, adjustment of the mA and/or kV according to patient size and/or use of iterative reconstruction technique. CONTRAST:  53mL  OMNIPAQUE IOHEXOL 350 MG/ML SOLN COMPARISON:  11/08/2021 FINDINGS: CTA NECK FINDINGS SKELETON: There is no bony spinal canal stenosis. No lytic or blastic lesion. OTHER NECK: Normal pharynx, larynx and major salivary glands. No cervical lymphadenopathy. Unremarkable thyroid gland. UPPER CHEST: No pneumothorax or pleural effusion. No nodules or masses. AORTIC ARCH: There is no calcific atherosclerosis of the aortic arch. There is no Armstrong, dissection or hemodynamically significant stenosis of the visualized portion of the aorta. Conventional 3 vessel aortic branching pattern. The visualized proximal subclavian arteries are widely patent. RIGHT CAROTID SYSTEM: Normal without Armstrong, dissection or stenosis. LEFT CAROTID SYSTEM: Normal without Armstrong, dissection or stenosis. VERTEBRAL ARTERIES: Left dominant configuration. Both origins are clearly patent. There is no dissection, occlusion or flow-limiting stenosis to the skull base (V1-V3 segments). CTA HEAD FINDINGS POSTERIOR CIRCULATION: --Vertebral arteries: Normal V4 segments. --Inferior cerebellar arteries: Normal. --Basilar artery: Normal. --Superior cerebellar arteries: Normal. --Posterior cerebral arteries (PCA): Normal. ANTERIOR CIRCULATION: --Intracranial internal carotid arteries: Normal. --Anterior cerebral arteries (ACA): Status post coiling of Dimas Scheck-comm Armstrong. There remains Aizley Stenseth 3 mm Armstrong at the left A1/Ladd Cen-comm Armstrong. There is  also fusiform 2 mm Armstrong of the Jak Haggar-comm. Both of these are unchanged. --Middle cerebral arteries (MCA): Normal. VENOUS SINUSES: As permitted by contrast timing, patent. ANATOMIC VARIANTS: None Review of the MIP images confirms the above findings. IMPRESSION: 1. No emergent large vessel occlusion or high-grade stenosis. 2. Status post coiling of dominant portion of Dartanyon Frankowski-comm Armstrong. 3. Unchanged 3 mm Armstrong at the left A1/Bari Handshoe-comm Armstrong and 2 mm fusiform Imara Standiford-comm Armstrong. Electronically Signed   By: Ulyses Jarred M.D.    On: 02/07/2022 23:18   MR BRAIN WO CONTRAST  Result Date: 02/07/2022 CLINICAL DATA:  Neuro deficit, acute, stroke suspected EXAM: MRI HEAD WITHOUT CONTRAST TECHNIQUE: Multiplanar, multiecho pulse sequences of the brain and surrounding structures were obtained without intravenous contrast. COMPARISON:  Same day CT head. FINDINGS: Brain: Multiple acute right cerebellar infarcts. Additional punctate acute infarcts in the left cerebellum and lateral left temporal lobe. Mild DWI hyperintensity in the left corona radiata is favored to represent T2 shine through given no ADC correlate and T2 hyperintensities in this region that probably correlate with prior white matter infarcts. No acute hemorrhage, mass lesion, midline shift, hydrocephalus, or extra-axial fluid collection. Vascular: Major arterial flow voids are maintained at the skull base. Skull and upper cervical spine: Normal marrow signal. Sinuses/Orbits: Clear sinuses.  No acute orbital findings. Other: No mastoid effusions. IMPRESSION: 1. Multiple acute right cerebellar infarcts. Additional punctate acute infarcts in the left cerebellum and lateral left temporal lobe. Mild associated edema without mass effect. Given involvement of multiple vascular territories, consider an embolic etiology. 2. Probable prior small white matter infarcts in the left corona radiata. Electronically Signed   By: Margaretha Sheffield M.D.   On: 02/07/2022 19:53   CT HEAD CODE STROKE WO CONTRAST  Result Date: 02/07/2022 CLINICAL DATA:  Code stroke.  Neuro deficit, acute, stroke suspected EXAM: CT HEAD WITHOUT CONTRAST TECHNIQUE: Contiguous axial images were obtained from the base of the skull through the vertex without intravenous contrast. RADIATION DOSE REDUCTION: This exam was performed according to the departmental dose-optimization program which includes automated exposure control, adjustment of the mA and/or kV according to patient size and/or use of iterative reconstruction  technique. COMPARISON:  CT head November 09, 2021. FINDINGS: Brain: Probably acute right cerebellar infarct with hypoattenuation loss of gray differentiation. No significant mass effect. Vascular: Armstrong coiling in the region of the anterior communicating artery. Streak limits surrounding area. No hyperdense vessel identified. Skull: No acute fracture. Sinuses/Orbits: Clear sinuses.  No acute orbital findings. Other: No mastoid effusions. IMPRESSION: 1. Probably acute right cerebellar infarct. No significant mass effect. Recommend MRI if the patient is able. 2. No acute hemorrhage. Findings discussed with Dr. Milas Gain via telephone at 5:18 PM. Electronically Signed   By: Margaretha Sheffield M.D.   On: 02/07/2022 17:21        Scheduled Meds:  aspirin EC  81 mg Oral Daily   atorvastatin  40 mg Oral Daily   sodium chloride flush  3 mL Intravenous Once   Continuous Infusions:   LOS: 0 days    Time spent: over 30 min    Fayrene Helper, MD [facility]   To contact the attending provider between 7A-7P or the covering provider during after hours 7P-7A, please log into the web site [email or web address] and access using universal [place] password for that web site. If you do not have the password, please call the hospital operator.  02/08/2022, 12:54 PM

## 2022-02-08 NOTE — ED Notes (Signed)
Pt notified this RN that she is still itching, hives now on bil upper arms and bil upper legs. No airway distress at this time. MD made aware.

## 2022-02-08 NOTE — Progress Notes (Addendum)
Supervising Physician: Pedro Earls  Patient Status:  [facility] - In-pt  Chief Complaint: Left-sided paralysis; Acute right cerebellar infarct   HPI: 36 year old female with a medical history significant for subarachnoid hemorrhage 11/08/21 due to [facility] aneurysm rupture. 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 She experienced complete recovery without any neurological deficits.   She underwent a follow up cerebral angiogram 02/04/22 with dr. Karenann Cai.  IR Angio 02/04/22 IMPRESSION: 1. 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 An additional tiny (1-2 mm) posteriorly projecting outpouching from the anterior communicating artery is only seen on post processed 3D angiogram images and may represent an additional aneurysm versus infundibulum. 2. 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  The patient was discharged home the same day with plans to follow up with Dr. Karenann Cai at a later time to discuss these findings. She presented to the ED 02/07/22 with complaints of left-sided numbness. Imaging was positive for an acute right cerebellar infarct.   Subjective: Patient awake/alert in bed in the ED. A female family member is at the bedside. The patient denies any pain/discomfort and states the left-sided numbness has resolved. She has no other complaints at this time.   Allergies: Iodinated contrast media  Medications: Prior to Admission medications   Medication Sig Start Date End Date Taking? Authorizing Provider  acetaminophen (TYLENOL) 500 MG tablet Take 1,000 mg by mouth every 6 (six) hours as needed for moderate pain.   Yes [provider]  Multiple Vitamins-Minerals (HAIR SKIN & NAILS ADVANCED) TABS Take 1 tablet by mouth daily.   Yes [provider]      Vital Signs: BP 130/74   Pulse 68   Temp 98.7 F (37.1 C)   Resp 11   Ht _0  (1.676 m)   Wt 206 lb (93.4 kg)   SpO2 100%   BMI 33.25 kg/m   Physical Exam Constitutional:      General: She is not in acute distress.    Appearance: She is not ill-appearing.  Pulmonary:     Effort: Pulmonary effort is normal.  Neurological:     Mental Status: She is alert and oriented to person, place, and time.     Cranial Nerves: No dysarthria or facial asymmetry.     Motor: No weakness.     Imaging: ECHOCARDIOGRAM COMPLETE BUBBLE STUDY  Result Date: 02/08/2022    ECHOCARDIOGRAM REPORT   Patient Name:   Debbie Armstrong Date of Exam: 02/08/2022 Medical Rec #:  [id number]   Height:       66.0 in Accession #:    [id number]  Weight:       206.0 lb Date of Birth:  18-Dec-1985   BSA:          2.025 m Patient Age:    36 years    BP:           107/39 mmHg Patient Gender: F           HR:           70 bpm. Exam Location:  Inpatient Procedure: 2D Echo, Color Doppler, Cardiac Doppler and Saline Contrast Bubble            Study Indications:    Stroke  History:        Patient has no prior history of Echocardiogram examinations.  Sonographer:    Danne Baxter RDCS, FE, PE Referring Phys: [id number] A CALDWELL POWELL JR IMPRESSIONS  1. Left ventricular ejection fraction, by estimation, is 60 to 65%. The left ventricle has normal function. The left ventricle has no regional wall motion abnormalities. Left ventricular diastolic parameters were normal.  2. Right ventricular systolic function is normal. The right ventricular size is normal. There is normal pulmonary artery systolic pressure.  3. The mitral valve is normal in structure. No evidence of mitral valve regurgitation.  4. The aortic valve is tricuspid. Aortic valve regurgitation is not visualized. No aortic stenosis is present.  5. The inferior vena cava is normal in size with greater than 50% respiratory variability, suggesting right atrial pressure of 3 mmHg.  6.  Agitated saline contrast bubble study was positive with shunting observed after >6 cardiac cycles suggestive of intrapulmonary shunting. FINDINGS  Left Ventricle: Left ventricular ejection fraction, by estimation, is 60 to 65%. The left ventricle has normal function. The left ventricle has no regional wall motion abnormalities. The left ventricular internal cavity size was normal in size. There is  no left ventricular hypertrophy. Left ventricular diastolic parameters were normal. Right Ventricle: The right ventricular size is normal. No increase in right ventricular wall thickness. Right ventricular systolic function is normal. There is normal pulmonary artery systolic pressure. The tricuspid regurgitant velocity is 1.64 m/s, and  with an assumed right atrial pressure of 3 mmHg, the estimated right ventricular systolic pressure is 67.3 mmHg. Left Atrium: Left atrial size was normal in size. Right Atrium: Right atrial size was normal in size. Pericardium: There is no evidence of pericardial effusion. Mitral Valve: The mitral valve is normal in structure. No evidence of mitral valve regurgitation. Tricuspid Valve: The tricuspid valve is normal in structure. Tricuspid valve regurgitation is trivial. Aortic Valve: The aortic valve is tricuspid. Aortic valve regurgitation is not visualized. No aortic stenosis is present. Pulmonic Valve: The pulmonic valve was grossly normal. Pulmonic valve regurgitation is trivial. Aorta: The aortic root and ascending aorta are structurally normal, with no evidence of dilitation. Venous: The inferior vena cava is normal in size with greater than 50% respiratory variability, suggesting right atrial pressure of 3 mmHg. IAS/Shunts: The interatrial septum was not well visualized. Agitated saline contrast was given intravenously to evaluate for intracardiac shunting. Agitated saline contrast bubble study was positive with shunting observed after >6 cardiac cycles suggestive of intrapulmonary  shunting.  LEFT VENTRICLE PLAX 2D LVIDd:         4.60 cm   Diastology LVIDs:         2.90 cm   LV e' medial:    11.30 cm/s LV PW:         0.90 cm   LV E/e' medial:  5.8 LV IVS:        0.80 cm   LV e' lateral:   13.50 cm/s LVOT diam:     1.90 cm   LV E/e' lateral: 4.8 LV SV:         52 LV SV Index:   26 LVOT Area:     2.84 cm  RIGHT VENTRICLE RV S prime:     12.80 cm/s TAPSE (M-mode): 2.0 cm LEFT ATRIUM             Index        RIGHT ATRIUM           Index LA diam:        2.70 cm 1.33 cm/m  RA Area:     11.40 cm LA Vol (A2C):   25.6 ml 12.64 ml/m  RA Volume:   24.80 ml  12.25 ml/m LA Vol (A4C):   29.9 ml 14.76 ml/m LA Biplane Vol: 27.7 ml 13.68 ml/m  AORTIC VALVE LVOT Vmax:   95.80 cm/s LVOT Vmean:  68.500 cm/s LVOT VTI:    0.183 m  AORTA Ao Root diam: 2.80 cm Ao Asc diam:  2.60 cm MITRAL VALVE               TRICUSPID VALVE MV Area (PHT): 2.74 cm    TR Peak grad:   10.8 mmHg MV Decel Time: 277 msec    TR Vmax:        164.00 cm/s MV E velocity: 65.00 cm/s MV A velocity: 66.60 cm/s  SHUNTS MV E/A ratio:  0.98        Systemic VTI:  0.18 m                            Systemic Diam: 1.90 cm Oswaldo Milian MD Electronically signed by Oswaldo Milian MD Signature Date/Time: 02/08/2022/12:07:40 PM    Final    VAS [place] LOWER EXTREMITY VENOUS (DVT)  Result Date: 02/08/2022  Lower Venous DVT Study Patient Name:  Debbie Armstrong  Date of Exam:   02/08/2022 Medical Rec #: [id number]    Accession #:    [id number] Date of Birth: [birth year]    Patient Gender: F Patient Age:   27 years Exam Location:  [facility] Procedure:      VAS [place] LOWER EXTREMITY VENOUS (DVT) Referring Phys: A POWELL JR --------------------------------------------------------------------------------  Indications: Stroke.  Comparison Study: No previous exam noted. Performing Technologist: Bobetta Lime BS, RVT  Examination Guidelines: A complete evaluation includes B-mode imaging, spectral Doppler, color Doppler, and power Doppler as needed  of all accessible portions of each vessel. Bilateral testing is considered an integral part of a complete examination. Limited examinations for reoccurring indications may be performed as noted. The reflux portion of the exam is performed with the patient in reverse Trendelenburg.  +---------+---------------+---------+-----------+----------+--------------+ RIGHT    CompressibilityPhasicitySpontaneityPropertiesThrombus Aging +---------+---------------+---------+-----------+----------+--------------+ CFV      Full           Yes      Yes                                 +---------+---------------+---------+-----------+----------+--------------+ SFJ      Full                                                        +---------+---------------+---------+-----------+----------+--------------+ FV Prox  Full                                                        +---------+---------------+---------+-----------+----------+--------------+ FV Mid   Full                                                        +---------+---------------+---------+-----------+----------+--------------+  FV DistalFull                                                        +---------+---------------+---------+-----------+----------+--------------+ PFV      Full                                                        +---------+---------------+---------+-----------+----------+--------------+ POP      Full           Yes      Yes                                 +---------+---------------+---------+-----------+----------+--------------+ PTV      Full                                                        +---------+---------------+---------+-----------+----------+--------------+ PERO     Full                                                        +---------+---------------+---------+-----------+----------+--------------+    +---------+---------------+---------+-----------+----------+--------------+ LEFT     CompressibilityPhasicitySpontaneityPropertiesThrombus Aging +---------+---------------+---------+-----------+----------+--------------+ CFV      Full           Yes      Yes                                 +---------+---------------+---------+-----------+----------+--------------+ SFJ      Full                                                        +---------+---------------+---------+-----------+----------+--------------+ FV Prox  Full                                                        +---------+---------------+---------+-----------+----------+--------------+ FV Mid   Full                                                        +---------+---------------+---------+-----------+----------+--------------+ FV DistalFull                                                        +---------+---------------+---------+-----------+----------+--------------+  PFV      Full                                                        +---------+---------------+---------+-----------+----------+--------------+ POP      Full           Yes      Yes                                 +---------+---------------+---------+-----------+----------+--------------+ PTV      Full                                                        +---------+---------------+---------+-----------+----------+--------------+ PERO     Full                                                        +---------+---------------+---------+-----------+----------+--------------+     Summary: BILATERAL: - No evidence of deep vein thrombosis seen in the lower extremities, bilaterally. -No evidence of popliteal cyst, bilaterally.   *See table(s) above for measurements and observations.    Preliminary    CT ANGIO HEAD NECK W WO CM (CODE STROKE)  Result Date: 02/07/2022 CLINICAL DATA:  Left-sided numbness. Status post coil  embolization of anterior communicating artery aneurysm. EXAM: CT ANGIOGRAPHY HEAD AND NECK TECHNIQUE: Multidetector CT imaging of the head and neck was performed using the standard protocol during bolus administration of intravenous contrast. Multiplanar CT image reconstructions and MIPs were obtained to evaluate the vascular anatomy. Carotid stenosis measurements (when applicable) are obtained utilizing NASCET criteria, using the distal internal carotid diameter as the denominator. RADIATION DOSE REDUCTION: This exam was performed according to the departmental dose-optimization program which includes automated exposure control, adjustment of the mA and/or kV according to patient size and/or use of iterative reconstruction technique. CONTRAST:  62m OMNIPAQUE IOHEXOL 350 MG/ML SOLN COMPARISON:  11/08/2021 FINDINGS: CTA NECK FINDINGS SKELETON: There is no bony spinal canal stenosis. No lytic or blastic lesion. OTHER NECK: Normal pharynx, larynx and major salivary glands. No cervical lymphadenopathy. Unremarkable thyroid gland. UPPER CHEST: No pneumothorax or pleural effusion. No nodules or masses. AORTIC ARCH: There is no calcific atherosclerosis of the aortic arch. There is no aneurysm, dissection or hemodynamically significant stenosis of the visualized portion of the aorta. Conventional 3 vessel aortic branching pattern. The visualized proximal subclavian arteries are widely patent. RIGHT CAROTID SYSTEM: Normal without aneurysm, dissection or stenosis. LEFT CAROTID SYSTEM: Normal without aneurysm, dissection or stenosis. VERTEBRAL ARTERIES: Left dominant configuration. Both origins are clearly patent. There is no dissection, occlusion or flow-limiting stenosis to the skull base (V1-V3 segments). CTA HEAD FINDINGS POSTERIOR CIRCULATION: --Vertebral arteries: Normal V4 segments. --Inferior cerebellar arteries: Normal. --Basilar artery: Normal. --Superior cerebellar arteries: Normal. --Posterior cerebral arteries  (PCA): Normal. ANTERIOR CIRCULATION: --Intracranial internal carotid arteries: Normal. --Anterior cerebral arteries (ACA): Status post coiling of A-comm aneurysm. There remains a 3 mm aneurysm at the left A1/A-comm junction. There  is also fusiform 2 mm dilatation of the A-comm. Both of these are unchanged. --Middle cerebral arteries (MCA): Normal. VENOUS SINUSES: As permitted by contrast timing, patent. ANATOMIC VARIANTS: None Review of the MIP images confirms the above findings. IMPRESSION: 1. No emergent large vessel occlusion or high-grade stenosis. 2. Status post coiling of dominant portion of A-comm aneurysm. 3. Unchanged 3 mm aneurysm at the left A1/A-comm junction and 2 mm fusiform a-comm dilatation. Electronically Signed   By: Ulyses Jarred M.D.   On: 02/07/2022 23:18   MR BRAIN WO CONTRAST  Result Date: 02/07/2022 CLINICAL DATA:  Neuro deficit, acute, stroke suspected EXAM: MRI HEAD WITHOUT CONTRAST TECHNIQUE: Multiplanar, multiecho pulse sequences of the brain and surrounding structures were obtained without intravenous contrast. COMPARISON:  Same day CT head. FINDINGS: Brain: Multiple acute right cerebellar infarcts. Additional punctate acute infarcts in the left cerebellum and lateral left temporal lobe. Mild DWI hyperintensity in the left corona radiata is favored to represent T2 shine through given no ADC correlate and T2 hyperintensities in this region that probably correlate with prior white matter infarcts. No acute hemorrhage, mass lesion, midline shift, hydrocephalus, or extra-axial fluid collection. Vascular: Major arterial flow voids are maintained at the skull base. Skull and upper cervical spine: Normal marrow signal. Sinuses/Orbits: Clear sinuses.  No acute orbital findings. Other: No mastoid effusions. IMPRESSION: 1. Multiple acute right cerebellar infarcts. Additional punctate acute infarcts in the left cerebellum and lateral left temporal lobe. Mild associated edema without mass effect.  Given involvement of multiple vascular territories, consider an embolic etiology. 2. Probable prior small white matter infarcts in the left corona radiata. Electronically Signed   By: Margaretha Sheffield M.D.   On: 02/07/2022 19:53   CT HEAD CODE STROKE WO CONTRAST  Result Date: 02/07/2022 CLINICAL DATA:  Code stroke.  Neuro deficit, acute, stroke suspected EXAM: CT HEAD WITHOUT CONTRAST TECHNIQUE: Contiguous axial images were obtained from the base of the skull through the vertex without intravenous contrast. RADIATION DOSE REDUCTION: This exam was performed according to the departmental dose-optimization program which includes automated exposure control, adjustment of the mA and/or kV according to patient size and/or use of iterative reconstruction technique. COMPARISON:  CT head November 09, 2021. FINDINGS: Brain: Probably acute right cerebellar infarct with hypoattenuation loss of gray differentiation. No significant mass effect. Vascular: Aneurysm coiling in the region of the anterior communicating artery. Streak limits surrounding area. No hyperdense vessel identified. Skull: No acute fracture. Sinuses/Orbits: Clear sinuses.  No acute orbital findings. Other: No mastoid effusions. IMPRESSION: 1. Probably acute right cerebellar infarct. No significant mass effect. Recommend MRI if the patient is able. 2. No acute hemorrhage. Findings discussed with Dr. Milas Gain via telephone at 5:18 PM. Electronically Signed   By: Margaretha Sheffield M.D.   On: 02/07/2022 17:21    Labs:  CBC: Recent Labs    11/17/21 0317 02/04/22 0726 02/07/22 1704 02/07/22 1710 02/08/22 0459  WBC 7.8 6.2 8.7  --  10.0  HGB 13.2 12.6 13.2 13.9 13.8  HCT 40.1 39.5 42.0 41.0 43.1  PLT 257 264 259  --  271    COAGS: Recent Labs    11/08/21 1626 02/04/22 0726 02/07/22 1704  INR 1.1 1.0 1.0  APTT 24  --  27    BMP: Recent Labs    11/17/21 0317 02/04/22 0726 02/07/22 1704 02/07/22 1710 02/08/22 0459  NA 133* 140 138 138 140   K 3.6 4.0 4.0 3.9 4.3  CL 103 110 105 104 107  CO2 21* 24 24  --  24  GLUCOSE 95 93 96 90 102*  BUN _0 CALCIUM 8.7* 8.8* 8.8*  --  9.0  CREATININE 0.74 0.85 0.76 0.70 0.87  GFRNONAA >60 >60 >60  --  >60    LIVER FUNCTION TESTS: Recent Labs    02/07/22 1704  BILITOT 0.4  AST 24  ALT 23  ALKPHOS 79  PROT 6.6  ALBUMIN 3.4*    Assessment and Plan:  Right cerebellar acute infarct:   Imaging was positive for multiple acute right cerebellar infarcts with additional punctate acute infarcts in the left cerebellum and lateral left temporal lobe. Embolic work up is in progress and the patient has had an echocardiogram, lower extremity duplex study and has a pending TEE. She will be admitted for further work up.   I met with the patient in the ED to let her know our team was aware of her clinical situation and that there are no plans for Neuro Interventional Radiology procedures but we remain available if needed. She has no questions/concerns for our team at this time.   Electronically Signed: Soyla Dryer, AGACNP-BC [phone] 02/08/2022, 12:27 PM   I spent a total of 15 Minutes at the the patient's bedside AND on the patient's hospital floor or unit, greater than 50% of which was counseling/coordinating care for right cerebellar infarct.

## 2022-02-08 NOTE — ED Notes (Signed)
Patient being transported by transport to room via stretcher with transport tech.  

## 2022-02-08 NOTE — Assessment & Plan Note (Signed)
Developed rash, hives after contrast Added to allergy list Antihistamine prn No indication for epi at this time

## 2022-02-08 NOTE — Plan of Care (Signed)
  Problem: Education: Goal: Understanding of CV disease, CV risk reduction, and recovery process will improve Outcome: Not Applicable Goal: Individualized Educational Video(s) Outcome: Not Applicable   Problem: Activity: Goal: Ability to return to baseline activity level will improve Outcome: Not Applicable   Problem: Cardiovascular: Goal: Ability to achieve and maintain adequate cardiovascular perfusion will improve Outcome: Not Applicable Goal: Vascular access site(s) Level 0-1 will be maintained Outcome: Not Applicable   Problem: Health Behavior/Discharge Planning: Goal: Ability to safely manage health-related needs after discharge will improve Outcome: Not Applicable   

## 2022-02-08 NOTE — Progress Notes (Addendum)
STROKE TEAM PROGRESS NOTE   INTERVAL HISTORY Her family is at the bedside.  Patient lying in bed NAD. Agreeable to stay until Thursday for TEE. Will also send hypercoagulable studies.  Vitals:   02/08/22 1000 02/08/22 1106 02/08/22 1200 02/08/22 1222  BP: 120/69 (!) 142/62 130/74   Pulse: 89 79 68   Resp: (!) 22 16 11    Temp:    98.7 F (37.1 C)  TempSrc:      SpO2: 100% 100% 100%   Weight:      Height:       CBC:  Recent Labs  Lab 02/07/22 1704 02/07/22 1710 02/08/22 0459  WBC 8.7  --  10.0  NEUTROABS 6.6  --   --   HGB 13.2 13.9 13.8  HCT 42.0 41.0 43.1  MCV 95.5  --  95.1  PLT 259  --  271   Basic Metabolic Panel:  Recent Labs  Lab 02/07/22 1704 02/07/22 1710 02/08/22 0459  NA 138 138 140  K 4.0 3.9 4.3  CL 105 104 107  CO2 24  --  24  GLUCOSE 96 90 102*  BUN 9 9 12   CREATININE 0.76 0.70 0.87  CALCIUM 8.8*  --  9.0   Lipid Panel:  Recent Labs  Lab 02/08/22 0459  CHOL 151  TRIG 37  HDL 52  CHOLHDL 2.9  VLDL 7  LDLCALC 92   HgbA1c:  Recent Labs  Lab 02/08/22 0459  HGBA1C 4.9   Urine Drug Screen:  Recent Labs  Lab 02/08/22 0630  LABOPIA NONE DETECTED  COCAINSCRNUR NONE DETECTED  LABBENZ NONE DETECTED  AMPHETMU NONE DETECTED  THCU NONE DETECTED  LABBARB NONE DETECTED    Alcohol Level  Recent Labs  Lab 02/07/22 1704  ETH <10    IMAGING past 24 hours ECHOCARDIOGRAM COMPLETE BUBBLE STUDY  Result Date: 02/08/2022    ECHOCARDIOGRAM REPORT   Patient Name:   Debbie Armstrong Date of Exam: 02/08/2022 Medical Rec #:  Debbie Armstrong   Height:       66.0 in Accession #:    04/11/2022  Weight:       206.0 lb Date of Birth:  08-May-1986   BSA:          2.025 m Patient Age:    35 years    BP:           107/39 mmHg Patient Gender: F           HR:           70 bpm. Exam Location:  Inpatient Procedure: 2D Echo, Color Doppler, Cardiac Doppler and Saline Contrast Bubble            Study Indications:    Stroke  History:        Patient has no prior history of  Echocardiogram examinations.  Sonographer:    [id number] RDCS, FE, PE Referring Phys: [phone] A CALDWELL POWELL JR IMPRESSIONS  1. Left ventricular ejection fraction, by estimation, is 60 to 65%. The left ventricle has normal function. The left ventricle has no regional wall motion abnormalities. Left ventricular diastolic parameters were normal.  2. Right ventricular systolic function is normal. The right ventricular size is normal. There is normal pulmonary artery systolic pressure.  3. The mitral valve is normal in structure. No evidence of mitral valve regurgitation.  4. The aortic valve is tricuspid. Aortic valve regurgitation is not visualized. No aortic stenosis is present.  5. The inferior vena cava is normal in  size with greater than 50% respiratory variability, suggesting right atrial pressure of 3 mmHg.  6. Agitated saline contrast bubble study was positive with shunting observed after >6 cardiac cycles suggestive of intrapulmonary shunting. FINDINGS  Left Ventricle: Left ventricular ejection fraction, by estimation, is 60 to 65%. The left ventricle has normal function. The left ventricle has no regional wall motion abnormalities. The left ventricular internal cavity size was normal in size. There is  no left ventricular hypertrophy. Left ventricular diastolic parameters were normal. Right Ventricle: The right ventricular size is normal. No increase in right ventricular wall thickness. Right ventricular systolic function is normal. There is normal pulmonary artery systolic pressure. The tricuspid regurgitant velocity is 1.64 m/s, and  with an assumed right atrial pressure of 3 mmHg, the estimated right ventricular systolic pressure is 13.8 mmHg. Left Atrium: Left atrial size was normal in size. Right Atrium: Right atrial size was normal in size. Pericardium: There is no evidence of pericardial effusion. Mitral Valve: The mitral valve is normal in structure. No evidence of mitral valve regurgitation.  Tricuspid Valve: The tricuspid valve is normal in structure. Tricuspid valve regurgitation is trivial. Aortic Valve: The aortic valve is tricuspid. Aortic valve regurgitation is not visualized. No aortic stenosis is present. Pulmonic Valve: The pulmonic valve was grossly normal. Pulmonic valve regurgitation is trivial. Aorta: The aortic root and ascending aorta are structurally normal, with no evidence of dilitation. Venous: The inferior vena cava is normal in size with greater than 50% respiratory variability, suggesting right atrial pressure of 3 mmHg. IAS/Shunts: The interatrial septum was not well visualized. Agitated saline contrast was given intravenously to evaluate for intracardiac shunting. Agitated saline contrast bubble study was positive with shunting observed after >6 cardiac cycles suggestive of intrapulmonary shunting.  LEFT VENTRICLE PLAX 2D LVIDd:         4.60 cm   Diastology LVIDs:         2.90 cm   LV e' medial:    11.30 cm/s LV PW:         0.90 cm   LV E/e' medial:  5.8 LV IVS:        0.80 cm   LV e' lateral:   13.50 cm/s LVOT diam:     1.90 cm   LV E/e' lateral: 4.8 LV SV:         52 LV SV Index:   26 LVOT Area:     2.84 cm  RIGHT VENTRICLE RV S prime:     12.80 cm/s TAPSE (M-mode): 2.0 cm LEFT ATRIUM             Index        RIGHT ATRIUM           Index LA diam:        2.70 cm 1.33 cm/m   RA Area:     11.40 cm LA Vol (A2C):   25.6 ml 12.64 ml/m  RA Volume:   24.80 ml  12.25 ml/m LA Vol (A4C):   29.9 ml 14.76 ml/m LA Biplane Vol: 27.7 ml 13.68 ml/m  AORTIC VALVE LVOT Vmax:   95.80 cm/s LVOT Vmean:  68.500 cm/s LVOT VTI:    0.183 m  AORTA Ao Root diam: 2.80 cm Ao Asc diam:  2.60 cm MITRAL VALVE               TRICUSPID VALVE MV Area (PHT): 2.74 cm    TR Peak grad:   10.8 mmHg MV Decel Time: 277  msec    TR Vmax:        164.00 cm/s MV E velocity: 65.00 cm/s MV A velocity: 66.60 cm/s  SHUNTS MV E/A ratio:  0.98        Systemic VTI:  0.18 m                            Systemic Diam: 1.90 cm  Epifanio Lesches MD Electronically signed by Epifanio Lesches MD Signature Date/Time: 02/08/2022/12:07:40 PM    Final    VAS [place] LOWER EXTREMITY VENOUS (DVT)  Result Date: 02/08/2022  Lower Venous DVT Study Patient Name:  NAFISAH RUNIONS  Date of Exam:   02/08/2022 Medical Rec #: [id number]    Accession #:    [id number] Date of Birth: 03/19/1986    Patient Gender: F Patient Age:   52 years Exam Location:  [facility] Procedure:      VAS [place] LOWER EXTREMITY VENOUS (DVT) Referring Phys: A POWELL JR --------------------------------------------------------------------------------  Indications: Stroke.  Comparison Study: No previous exam noted. Performing Technologist: Magdalene River BS, RVT  Examination Guidelines: A complete evaluation includes B-mode imaging, spectral Doppler, color Doppler, and power Doppler as needed of all accessible portions of each vessel. Bilateral testing is considered an integral part of a complete examination. Limited examinations for reoccurring indications may be performed as noted. The reflux portion of the exam is performed with the patient in reverse Trendelenburg.  +---------+---------------+---------+-----------+----------+--------------+ RIGHT    CompressibilityPhasicitySpontaneityPropertiesThrombus Aging +---------+---------------+---------+-----------+----------+--------------+ CFV      Full           Yes      Yes                                 +---------+---------------+---------+-----------+----------+--------------+ SFJ      Full                                                        +---------+---------------+---------+-----------+----------+--------------+ FV Prox  Full                                                        +---------+---------------+---------+-----------+----------+--------------+ FV Mid   Full                                                         +---------+---------------+---------+-----------+----------+--------------+ FV DistalFull                                                        +---------+---------------+---------+-----------+----------+--------------+ PFV      Full                                                        +---------+---------------+---------+-----------+----------+--------------+  POP      Full           Yes      Yes                                 +---------+---------------+---------+-----------+----------+--------------+ PTV      Full                                                        +---------+---------------+---------+-----------+----------+--------------+ PERO     Full                                                        +---------+---------------+---------+-----------+----------+--------------+   +---------+---------------+---------+-----------+----------+--------------+ LEFT     CompressibilityPhasicitySpontaneityPropertiesThrombus Aging +---------+---------------+---------+-----------+----------+--------------+ CFV      Full           Yes      Yes                                 +---------+---------------+---------+-----------+----------+--------------+ SFJ      Full                                                        +---------+---------------+---------+-----------+----------+--------------+ FV Prox  Full                                                        +---------+---------------+---------+-----------+----------+--------------+ FV Mid   Full                                                        +---------+---------------+---------+-----------+----------+--------------+ FV DistalFull                                                        +---------+---------------+---------+-----------+----------+--------------+ PFV      Full                                                         +---------+---------------+---------+-----------+----------+--------------+ POP      Full           Yes      Yes                                 +---------+---------------+---------+-----------+----------+--------------+  PTV      Full                                                        +---------+---------------+---------+-----------+----------+--------------+ PERO     Full                                                        +---------+---------------+---------+-----------+----------+--------------+     Summary: BILATERAL: - No evidence of deep vein thrombosis seen in the lower extremities, bilaterally. -No evidence of popliteal cyst, bilaterally.   *See table(s) above for measurements and observations.    Preliminary    CT ANGIO HEAD NECK W WO CM (CODE STROKE)  Result Date: 02/07/2022 CLINICAL DATA:  Left-sided numbness. 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 EXAM: CT ANGIOGRAPHY HEAD AND NECK TECHNIQUE: Multidetector CT imaging of the head and neck was performed using the standard protocol during bolus administration of intravenous contrast. Multiplanar CT image reconstructions and MIPs were obtained to evaluate the vascular anatomy. Carotid stenosis measurements (when applicable) are obtained utilizing NASCET criteria, using the distal internal carotid diameter as the denominator. RADIATION DOSE REDUCTION: This exam was performed according to the departmental dose-optimization program which includes automated exposure control, adjustment of the mA and/or kV according to patient size and/or use of iterative reconstruction technique. CONTRAST:  53mL OMNIPAQUE IOHEXOL 350 MG/ML SOLN COMPARISON:  11/08/2021 FINDINGS: CTA NECK FINDINGS SKELETON: There is no bony spinal canal stenosis. No lytic or blastic lesion. OTHER NECK: Normal pharynx, larynx and major salivary glands. No cervical lymphadenopathy. Unremarkable thyroid gland. UPPER CHEST: No  pneumothorax or pleural effusion. No nodules or masses. AORTIC ARCH: There is no calcific atherosclerosis of the aortic arch. There is no aneurysm, dissection or hemodynamically significant stenosis of the visualized portion of the aorta. Conventional 3 vessel aortic branching pattern. The visualized proximal subclavian arteries are widely patent. RIGHT CAROTID SYSTEM: Normal without aneurysm, dissection or stenosis. LEFT CAROTID SYSTEM: Normal without aneurysm, dissection or stenosis. VERTEBRAL ARTERIES: Left dominant configuration. Both origins are clearly patent. There is no dissection, occlusion or flow-limiting stenosis to the skull base (V1-V3 segments). CTA HEAD FINDINGS POSTERIOR CIRCULATION: --Vertebral arteries: Normal V4 segments. --Inferior cerebellar arteries: Normal. --Basilar artery: Normal. --Superior cerebellar arteries: Normal. --Posterior cerebral arteries (PCA): Normal. ANTERIOR CIRCULATION: --Intracranial internal carotid arteries: Normal. --Anterior cerebral arteries (ACA): Status post coiling of A-comm aneurysm. There remains a 3 mm aneurysm at the left A1/A-comm junction. There is also fusiform 2 mm dilatation of the A-comm. Both of these are unchanged. --Middle cerebral arteries (MCA): Normal. VENOUS SINUSES: As permitted by contrast timing, patent. ANATOMIC VARIANTS: None Review of the MIP images confirms the above findings. IMPRESSION: 1. No emergent large vessel occlusion or high-grade stenosis. 2. Status post coiling of dominant portion of A-comm aneurysm. 3. Unchanged 3 mm aneurysm at the left A1/A-comm junction and 2 mm fusiform a-comm dilatation. Electronically Signed   By: Ulyses Jarred M.D.   On: 02/07/2022 23:18   MR BRAIN WO CONTRAST  Result Date: 02/07/2022 CLINICAL DATA:  Neuro deficit, acute, stroke suspected EXAM: MRI HEAD WITHOUT CONTRAST TECHNIQUE: Multiplanar, multiecho pulse sequences of the brain and  surrounding structures were obtained without intravenous contrast.  COMPARISON:  Same day CT head. FINDINGS: Brain: Multiple acute right cerebellar infarcts. Additional punctate acute infarcts in the left cerebellum and lateral left temporal lobe. Mild DWI hyperintensity in the left corona radiata is favored to represent T2 shine through given no ADC correlate and T2 hyperintensities in this region that probably correlate with prior white matter infarcts. No acute hemorrhage, mass lesion, midline shift, hydrocephalus, or extra-axial fluid collection. Vascular: Major arterial flow voids are maintained at the skull base. Skull and upper cervical spine: Normal marrow signal. Sinuses/Orbits: Clear sinuses.  No acute orbital findings. Other: No mastoid effusions. IMPRESSION: 1. Multiple acute right cerebellar infarcts. Additional punctate acute infarcts in the left cerebellum and lateral left temporal lobe. Mild associated edema without mass effect. Given involvement of multiple vascular territories, consider an embolic etiology. 2. Probable prior small white matter infarcts in the left corona radiata. Electronically Signed   By: Margaretha Sheffield M.D.   On: 02/07/2022 19:53   CT HEAD CODE STROKE WO CONTRAST  Result Date: 02/07/2022 CLINICAL DATA:  Code stroke.  Neuro deficit, acute, stroke suspected EXAM: CT HEAD WITHOUT CONTRAST TECHNIQUE: Contiguous axial images were obtained from the base of the skull through the vertex without intravenous contrast. RADIATION DOSE REDUCTION: This exam was performed according to the departmental dose-optimization program which includes automated exposure control, adjustment of the mA and/or kV according to patient size and/or use of iterative reconstruction technique. COMPARISON:  CT head November 09, 2021. FINDINGS: Brain: Probably acute right cerebellar infarct with hypoattenuation loss of gray differentiation. No significant mass effect. Vascular: Aneurysm coiling in the region of the anterior communicating artery. Streak limits surrounding area.  No hyperdense vessel identified. Skull: No acute fracture. Sinuses/Orbits: Clear sinuses.  No acute orbital findings. Other: No mastoid effusions. IMPRESSION: 1. Probably acute right cerebellar infarct. No significant mass effect. Recommend MRI if the patient is able. 2. No acute hemorrhage. Findings discussed with Dr. Milas Gain via telephone at 5:18 PM. Electronically Signed   By: Margaretha Sheffield M.D.   On: 02/07/2022 17:21    PHYSICAL EXAM Constitutional: Appears well-developed and well-nourished.  Psych: Affect appropriate to situation Eyes: Normal external eye and conjunctiva. HENT: Normocephalic, no lesions, without obvious abnormality.   Musculoskeletal-no joint tenderness, deformity or swelling Cardiovascular: Normal rate and regular rhythm.  Respiratory: Effort normal, non-labored breathing saturations WNL GI: Soft.  No distension. There is no tenderness.  Skin: WDI, patient has a red itchy rash on overall body.   Neuro:  Mental Status: Alert, oriented, thought content appropriate.  Speech fluent without evidence of aphasia.  Able to follow commands without difficulty. Cranial Nerves:  Visual fields grossly normal,  EOEMI, PERRL,smile symmetric, facial light touch sensation normal bilaterally hearing normal bilaterally  midline tongue extension Motor/Sensory: Right : Upper extremity   5/5  Left:     Upper extremity   5/5  Lower extremity   5/5   Lower extremity   5/5 Tone and bulk:normal tone throughout; no atrophy noted light touch intact throughout, bilaterally Cerebellar: normal finger-to-nose,  Gait: not tested     ASSESSMENT/PLAN Debbie Armstrong is a 36 y.o. 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 Raliegh Ip. de Sindy Messing. She ws neurologically intact without deficits at discharge.  Most recently she underwent diagnostic anioggram with D.  Sindy Messing 3 days ago on July 6 without difficulty and was discharged home. Today patient was at her baseline until while driving at [id number] she first began to notice numbness/tingling in her left hand which progressed to numbness/tingling in her left face along with lightheadedness over the next 15 minutes which did not resolve thus prompting her to report to ED where Code Stroke was activated. CT revealed right cerebellar acute infarct.   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 However, pt right VA quite hypoplastic and left VA access only during angio should not cause significant right cerebellar infarct.  Could be PFO in young - embolic pattern with PFO, ROPE score high, however, symptoms not fit to the stroke area although right brain TIA can not excluded  Left hand, arm and face numbness, acute onset, lasted 2.5 hours. No HA, no hx of migraine.  Code Stroke CT head probably right subacute cerebellar infarct. No acute hemorrhage.  CTA head & neck no LVO MRI  Multiple acute right cerebellar infarcts. Additional punctate acute infarcts in the left cerebellum and lateral left temporal lobe. Probable prior small white matter infarcts in the left corona radiata 2D Echo 60 to 65%, positive for PFO  BLE dopplers no DVT TEE pending Hypercoagulable labs: pending  LDL 92 HgbA1c 4.9 VTE prophylaxis - SCD's No antithrombotic prior to admission, now on aspirin 81 mg daily and plavix DAPT.  Therapy recommendations:  none Disposition:  pending  PFO TTE showed positive PFO TEE pending ROPE score = 9 On ASA and plavix now May need to discuss with Dr. 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  CT showed bilateral SAH.  CT head and neck showed ACOM aneurysm but also left ICA supplies left PCA stroke P-comm, left MCA and ACA supplied from right ICA via ACOM.  Had IR again showed 3 small ACOM aneurysms status post coiling, and also 17mm paraophthalmic aneurysm.  Discharged home without residual 02/04/2022 had cerebral angio showed stable coiled aneurysm, stable 3.5x2.6 right ICA ophthalmic aneurysm. No reported symptoms after procedure except rash and itchiness that night.   BP management Home meds:  none Stable Long-term BP goal normotensive  Hyperlipidemia Home meds:  none  LDL 92, goal < 70 Add atorvastatin 40 mg daily  Continue statin at discharge  Other Stroke Risk Factors Obesity, Body mass index is 33.25 kg/m., BMI >/= 30 associated with increased stroke risk, recommend weight loss, diet and exercise as appropriate   Other active issues Iodine contrast allergy - body rash and itchiness, status post Benadryl  Hospital day # 0  I discussed with Dr. Florene Glen. I spent extensive time with the patient, more than 50% of which was spent in counseling and coordination of care, reviewing test results, images and medication, and discussing the diagnosis, treatment plan and potential prognosis. This patient's care requiresreview of multiple databases, neurological assessment, discussion with family, other specialists and medical decision making of high complexity. I also discussed with Dr. Malen Gauze.    Rosalin Hawking, MD PhD Stroke Neurology 02/08/2022 5:12 PM  To contact Stroke Continuity provider, please refer to [email or web address] After hours, contact General Neurology

## 2022-02-08 NOTE — Progress Notes (Signed)
PT Cancellation Note  Patient Details Name: Debbie Armstrong MRN: [id number] DOB: May 17, 1986   Cancelled Treatment:    Reason Eval/Treat Not Completed: PT screened, no needs identified, will sign off Pt independent with no skilled PT needs. If needs change, please re-consult.   Cindee Salt, DPT  Acute Rehabilitation Services  Office: [phone]    Lehman Prom 02/08/2022, 10:30 AM

## 2022-02-09 ENCOUNTER — Encounter (HOSPITAL_COMMUNITY): Payer: Self-pay | Admitting: Family Medicine

## 2022-02-09 DIAGNOSIS — I6340 Cerebral infarction due to embolism of unspecified cerebral artery: Secondary | ICD-10-CM | POA: Diagnosis not present

## 2022-02-09 DIAGNOSIS — I639 Cerebral infarction, unspecified: Secondary | ICD-10-CM | POA: Diagnosis not present

## 2022-02-09 LAB — COMPREHENSIVE METABOLIC PANEL
ALT: 18 U/L (ref 0–44)
AST: 17 U/L (ref 15–41)
Albumin: 2.8 g/dL — ABNORMAL LOW (ref 3.5–5.0)
Alkaline Phosphatase: 71 U/L (ref 38–126)
Anion gap: 8 (ref 5–15)
BUN: 12 mg/dL (ref 6–20)
CO2: 24 mmol/L (ref 22–32)
Calcium: 8.5 mg/dL — ABNORMAL LOW (ref 8.9–10.3)
Chloride: 106 mmol/L (ref 98–111)
Creatinine, Ser: 0.9 mg/dL (ref 0.44–1.00)
GFR, Estimated: >60 mL/min (ref >60)
Glucose, Bld: 108 mg/dL — ABNORMAL HIGH (ref 70–99)
Potassium: 3.7 mmol/L (ref 3.5–5.1)
Sodium: 138 mmol/L (ref 135–145)
Total Bilirubin: 0.4 mg/dL (ref 0.3–1.2)
Total Protein: 5.2 g/dL — ABNORMAL LOW (ref 6.5–8.1)

## 2022-02-09 LAB — BETA-2-GLYCOPROTEIN I ABS, IGG/M/A
Beta-2 Glyco I IgG: <9 GPI IgG units (ref 0–20)
Beta-2-Glycoprotein I IgA: <9 GPI IgA units (ref 0–25)
Beta-2-Glycoprotein I IgM: <9 GPI IgM units (ref 0–32)

## 2022-02-09 LAB — CBC WITH DIFFERENTIAL/PLATELET
Abs Immature Granulocytes: 0.04 10*3/uL (ref 0.00–0.07)
Basophils Absolute: 0 10*3/uL (ref 0.0–0.1)
Basophils Relative: 0 %
Eosinophils Absolute: 0.3 10*3/uL (ref 0.0–0.5)
Eosinophils Relative: 5 %
HCT: 37.1 % (ref 36.0–46.0)
Hemoglobin: 12.3 g/dL (ref 12.0–15.0)
Immature Granulocytes: 1 %
Lymphocytes Relative: 32 %
Lymphs Abs: 2.3 10*3/uL (ref 0.7–4.0)
MCH: 30.4 pg (ref 26.0–34.0)
MCHC: 33.2 g/dL (ref 30.0–36.0)
MCV: 91.6 fL (ref 80.0–100.0)
Monocytes Absolute: 0.5 10*3/uL (ref 0.1–1.0)
Monocytes Relative: 7 %
Neutro Abs: 4 10*3/uL (ref 1.7–7.7)
Neutrophils Relative %: 55 %
Platelets: 251 10*3/uL (ref 150–400)
RBC: 4.05 MIL/uL (ref 3.87–5.11)
RDW: 12.5 % (ref 11.5–15.5)
WBC: 7.1 10*3/uL (ref 4.0–10.5)
nRBC: 0 % (ref 0.0–0.2)

## 2022-02-09 LAB — PHOSPHORUS: Phosphorus: 4.7 mg/dL — ABNORMAL HIGH (ref 2.5–4.6)

## 2022-02-09 LAB — CARDIOLIPIN ANTIBODIES, IGG, IGM, IGA
Anticardiolipin IgA: <9 APL U/mL (ref 0–11)
Anticardiolipin IgG: <9 GPL U/mL (ref 0–14)
Anticardiolipin IgM: 10 MPL U/mL (ref 0–12)

## 2022-02-09 LAB — MAGNESIUM: Magnesium: 2 mg/dL (ref 1.7–2.4)

## 2022-02-09 NOTE — Evaluation (Signed)
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 02/07/2022   Past Surgical History:  Past Surgical History:  Procedure Laterality Date   IR 3D INDEPENDENT WKST  11/08/2021   IR 3D INDEPENDENT WKST  02/04/2022   IR ANGIO INTRA EXTRACRAN SEL INTERNAL CAROTID BILAT MOD SED  11/08/2021   IR ANGIO INTRA EXTRACRAN SEL INTERNAL CAROTID BILAT MOD SED  02/04/2022   IR ANGIO VERTEBRAL SEL VERTEBRAL BILAT MOD SED  02/04/2022   IR ANGIO VERTEBRAL SEL VERTEBRAL UNI L MOD SED  11/08/2021   IR ANGIOGRAM FOLLOW UP STUDY  11/08/2021   IR ANGIOGRAM FOLLOW UP STUDY  11/08/2021   IR ANGIOGRAM FOLLOW UP STUDY  11/08/2021   IR NEURO EACH ADD'L AFTER BASIC UNI RIGHT (MS)  11/08/2021   IR TRANSCATH/EMBOLIZ  11/08/2021   IR US GUIDE VASC ACCESS RIGHT  11/08/2021   IR US GUIDE VASC ACCESS RIGHT  02/04/2022   RADIOLOGY WITH ANESTHESIA N/A 11/08/2021   Procedure: INTERVENTIONAL RADIOLOGY WITH ANESTHESIA;  Surgeon: Radiologist, Medication, MD;  Location: [facility] OR;  Service: Radiology;  Laterality: N/A;   HPI:  36 yo female presenting to ED on 7/9 with L side numbness. MRI showing acute R cerebellar infarcts with additional punctate infarcts in the L cerebellum and lateral L temporal lobe. 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   Assessment / Plan /  Recommendation Clinical Impression  Pt's cognition and communication appear to be at her baseline. She scored 29/30 on the SLUMS which is considered to be WNL (did make errors on the clock as well, but self-corrected). She believes that her speech was clear but slow upon admission when she was experiencing some numbness of her face, but that the numbness has resolved and so have any acute changes to her speech. No SLP needs identified at this time.    SLP Assessment  SLP Recommendation/Assessment: Patient does not need any further Speech Lanaguage Pathology Services SLP Visit Diagnosis: Dysarthria and anarthria (R47.1)    Recommendations for follow up therapy are one component of a multi-disciplinary discharge planning process, led by the attending physician.  Recommendations may be updated based on patient status, additional functional criteria and insurance authorization.    Follow Up Recommendations  No SLP follow up    Assistance Recommended at Discharge  PRN  Functional Status Assessment Patient has not had a recent decline in their functional status  Frequency and Duration           SLP Evaluation Cognition  Overall Cognitive Status: Within Functional Limits for tasks assessed       Comprehension  Auditory Comprehension Overall Auditory Comprehension: Appears within functional limits for tasks assessed    Expression Expression Primary Mode of Expression: Verbal Verbal Expression Overall Verbal Expression: Appears within functional limits for tasks assessed   Oral / Motor  Motor Speech Overall Motor Speech: Appears within functional limits  for tasks assessed            Mahala Menghini., M.A. CCC-SLP Acute Rehabilitation Services Office [phone]  Secure chat preferred  02/09/2022, 3:01 PM

## 2022-02-09 NOTE — TOC Initial Note (Signed)
Transition of Care [facility]) - Initial/Assessment Note    Patient Details  Name: Debbie Armstrong MRN: [id number] Date of Birth: 06/24/1986  Transition of Care [facility]) CM/SW Contact:    Kermit Balo, RN Phone Number: 02/09/2022, 11:13 AM  Clinical Narrative:                 Pt is from home with her children. She drives self as needed. Pt was not taking any medications prior to admission. [facility] [address]. No PCP. Pt asked that CM assist in finding a PcP. CM was able to get an appointment with [facility] and placed the information on the AVS. Pt has transport home when medically ready for d/c.   Expected Discharge Plan: Home/Self Care Barriers to Discharge: Continued Medical Work up   Patient Goals and CMS Choice        Expected Discharge Plan and Services Expected Discharge Plan: Home/Self Care   Discharge Planning Services: CM Consult   Living arrangements for the past 2 months: Apartment                                      Prior Living Arrangements/Services Living arrangements for the past 2 months: Apartment Lives with:: Minor Children Patient language and need for interpreter reviewed:: Yes Do you feel safe going back to the place where you live?: Yes            Criminal Activity/Legal Involvement Pertinent to Current Situation/Hospitalization: No - Comment as needed  Activities of Daily Living Home Assistive Devices/Equipment: None ADL Screening (condition at time of admission) Patient's cognitive ability adequate to safely complete daily activities?: Yes Is the patient deaf or have difficulty hearing?: No Does the patient have difficulty seeing, even when wearing glasses/contacts?: No Does the patient have difficulty concentrating, remembering, or making decisions?: No Patient able to express need for assistance with ADLs?: Yes Does the patient have difficulty dressing or bathing?: No Independently performs ADLs?: Yes (appropriate for  developmental age) Does the patient have difficulty walking or climbing stairs?: No Weakness of Legs: None Weakness of Arms/Hands: None  Permission Sought/Granted                  Emotional Assessment Appearance:: Appears stated age Attitude/Demeanor/Rapport: Engaged Affect (typically observed): Accepting Orientation: : Oriented to Self, Oriented to Place, Oriented to  Time, Oriented to Situation   Psych Involvement: No (comment)  Admission diagnosis:  Numbness [R20.0] Stroke [facility] [I63.9] Left sided numbness [R20.0] Acute CVA (cerebrovascular accident) [facility]) [I63.9] Patient Active Problem List   Diagnosis Date Noted   Contrast media adverse reaction 02/08/2022   Obesity (BMI 30-39.9) 02/08/2022   Stroke (HCC) 02/07/2022   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 [place] [address] Phone: [phone] Fax: [phone]     Social Determinants of Health (SDOH) Interventions    Readmission Risk Interventions     No data to display

## 2022-02-09 NOTE — Progress Notes (Signed)
PROGRESS NOTE    Debbie Armstrong  JEH:[id number] DOB: 1985-10-09 DOA: 02/07/2022 PCP: Pcp, No  Chief Complaint  Patient presents with   Code Stroke    Brief Narrative:  Debbie Armstrong is Debbie Armstrong 36 y.o. 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  Incidental finding of acute right cerebellar infarct on CT head.  MRI also with strokes.  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   MRI brain with multiple acute R cerebellar infarcts, additional punctate acute infarcts in L cerebellum and lateral L temporal lobe, mild edema without mass effect.  Probable prior small white matter infarcts in L corona radiata.  CTA head/neck without emergent LVO or high grade stenosis, s/p coiling of dominant portion of Malina Geers comm aneurysm, unchanged 3 mm aneurysm at the L A1/Simara Rhyner-comm junction and 2 mm fusiform Dshaun Reppucci-comm dilatation Stroke related to recent angiogram? Needs comprehensive w/u given age.  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 Tommie Sams.  Was also noted to have right ICA ophthalmic  segment aneurysm at the time.  Repeat cerebral angiogram 02/04/2022 showed complete occlusion of the previously coiled small anterior communicating artery aneurysms without evidence of coil compaction.  Contrast media adverse reaction Developed rash, hives after contrast Added to allergy list Antihistamine prn No indication for epi at this time  Obesity (BMI 30-39.9) Noted          DVT prophylaxis: SCD Code Status: full Family Communication: sister at bedside Disposition:   Status is: Observation The patient will require care spanning > 2 midnights and should be moved to inpatient because: pending stroke workup completion.  Important that her workup is done inpatient given age, positive bubble study, multiple infarcts on imaging.  Discussed with neurology who recommended TEE be completed inpatient given her age.   Consultants:  neurology  Procedures:  IMPRESSIONS     1. Left ventricular ejection fraction, by estimation, is 60 to 65%. The  left ventricle has normal function. The left ventricle has no regional  wall motion abnormalities. Left ventricular diastolic parameters were  normal.   2. Right ventricular systolic function is normal. The right ventricular  size is normal. There is normal pulmonary artery systolic pressure.   3. The mitral valve is normal in structure. No evidence of mitral valve  regurgitation.   4. The aortic valve is tricuspid. Aortic valve regurgitation is not  visualized. No aortic stenosis is present.   5. The inferior vena cava is normal in size with greater than 50%  respiratory variability, suggesting right atrial pressure of 3 mmHg.   6. Agitated saline contrast bubble study was positive with shunting  observed after >6 cardiac  cycles suggestive of intrapulmonary shunting.   Summary:  BILATERAL:  - No evidence of deep vein thrombosis seen in the lower extremities,  bilaterally.  -No evidence of popliteal cyst,  bilaterally  Antimicrobials:  Anti-infectives (From admission, onward)    None       Subjective: No complaints  Objective: Vitals:   02/08/22 2321 02/09/22 0804 02/09/22 1100 02/09/22 1958  BP: 122/60 (!) 114/57 127/67 111/70  Pulse: 85 (!) 58 60 64  Resp:  16 16 16   Temp:  98.6 F (37 C) 98.1 F (36.7 C) 98.5 F (36.9 C)  TempSrc: Oral Oral Oral Oral  SpO2: 95% 99% 98% 100%  Weight:      Height:       No intake or output data in the 24 hours ending 02/09/22 2013 Filed Weights   02/08/22 0813 02/08/22 2049  Weight: 93.4 kg 96.5 kg    Examination:  General: No acute distress. Cardiovascular: RRR Lungs:unlabored Abdomen: Soft, nontender, nondistended  Neurological: Alert and oriented 3. Moves all extremities 4 . Cranial nerves II through XII grossly intact. Extremities: No clubbing or cyanosis. No edema.    Data Reviewed: I have personally reviewed following labs and imaging studies  CBC: Recent Labs  Lab 02/04/22 0726 02/07/22 1704 02/07/22 1710 02/08/22 0459 02/09/22 0201  WBC 6.2 8.7  --  10.0 7.1  NEUTROABS  --  6.6  --   --  4.0  HGB 12.6 13.2 13.9 13.8 12.3  HCT 39.5 42.0 41.0 43.1 37.1  MCV 93.6 95.5  --  95.1 91.6  PLT 264 259  --  271 [id number]    Basic Metabolic Panel: Recent Labs  Lab 02/04/22 0726 02/07/22 1704 02/07/22 1710 02/08/22 0459 02/09/22 0201  NA 140 138 138 140 138  K 4.0 4.0 3.9 4.3 3.7  CL 110 105 104 107 106  CO2 24 24  --  24 24  GLUCOSE 93 96 90 102* 108*  BUN 20 9 9 12 12   CREATININE 0.85 0.76 0.70 0.87 0.90  CALCIUM 8.8* 8.8*  --  9.0 8.5*  MG  --   --   --   --  2.0  PHOS  --   --   --   --  4.7*    GFR: Estimated Creatinine Clearance: 101.2 mL/min (by C-G formula based on SCr of 0.9 mg/dL).  Liver Function Tests: Recent Labs  Lab 02/07/22 1704 02/09/22 0201  AST 24 17  ALT 23 18  ALKPHOS 79 71  BILITOT 0.4 0.4  PROT 6.6 5.2*  ALBUMIN 3.4* 2.8*    CBG: Recent Labs  Lab 02/07/22 1703  GLUCAP  91     No results found for this or any previous visit (from the past 240 hour(s)).       Radiology Studies: VAS [place] LOWER EXTREMITY VENOUS (DVT)  Result Date: 02/08/2022  Lower Venous DVT Study Patient Name:  Debbie Armstrong  Date of Exam:   02/08/2022 Medical Rec #: [id number]    Accession #:    [id number] Date of Birth: 08-12-1985    Patient Gender: F Patient Age:   73 years Exam Location:  [facility] Procedure:      VAS [place] LOWER EXTREMITY VENOUS (DVT) Referring Phys: Geralda Baumgardner POWELL JR --------------------------------------------------------------------------------  Indications: Stroke.  Comparison Study: No previous exam noted. Performing Technologist: Bobetta Lime BS, RVT  Examination Guidelines: Carolene Gitto complete evaluation includes B-mode imaging, spectral Doppler, color Doppler, and power Doppler as needed of all accessible portions  of each vessel. Bilateral testing is considered an integral part of Kamillah Didonato complete examination. Limited examinations for reoccurring indications may be performed as noted. The reflux portion of the exam is performed with the patient in reverse Trendelenburg.  +---------+---------------+---------+-----------+----------+--------------+ RIGHT    CompressibilityPhasicitySpontaneityPropertiesThrombus Aging +---------+---------------+---------+-----------+----------+--------------+ CFV      Full           Yes      Yes                                 +---------+---------------+---------+-----------+----------+--------------+ SFJ      Full                                                        +---------+---------------+---------+-----------+----------+--------------+ FV Prox  Full                                                        +---------+---------------+---------+-----------+----------+--------------+ FV Mid   Full                                                        +---------+---------------+---------+-----------+----------+--------------+  FV DistalFull                                                        +---------+---------------+---------+-----------+----------+--------------+ PFV      Full                                                        +---------+---------------+---------+-----------+----------+--------------+ POP      Full           Yes      Yes                                 +---------+---------------+---------+-----------+----------+--------------+ PTV      Full                                                        +---------+---------------+---------+-----------+----------+--------------+ PERO     Full                                                        +---------+---------------+---------+-----------+----------+--------------+   +---------+---------------+---------+-----------+----------+--------------+ LEFT  CompressibilityPhasicitySpontaneityPropertiesThrombus Aging +---------+---------------+---------+-----------+----------+--------------+ CFV      Full           Yes      Yes                                 +---------+---------------+---------+-----------+----------+--------------+ SFJ      Full                                                        +---------+---------------+---------+-----------+----------+--------------+ FV Prox  Full                                                        +---------+---------------+---------+-----------+----------+--------------+ FV Mid   Full                                                        +---------+---------------+---------+-----------+----------+--------------+ FV DistalFull                                                        +---------+---------------+---------+-----------+----------+--------------+ PFV      Full                                                        +---------+---------------+---------+-----------+----------+--------------+ POP      Full           Yes      Yes                                  +---------+---------------+---------+-----------+----------+--------------+ PTV      Full                                                        +---------+---------------+---------+-----------+----------+--------------+ PERO     Full                                                        +---------+---------------+---------+-----------+----------+--------------+     Summary: BILATERAL: - No evidence of deep vein thrombosis seen in the lower extremities, bilaterally. -No evidence of popliteal cyst, bilaterally.   *See table(s) above for measurements and observations. Electronically signed by Monica Martinez MD on 02/08/2022 at 1:50:55 PM.    Final  ECHOCARDIOGRAM COMPLETE BUBBLE STUDY  Result Date: 02/08/2022    ECHOCARDIOGRAM REPORT   Patient Name:   Debbie Armstrong Date of Exam: 02/08/2022 Medical Rec #:  [id number]   Height:       66.0 in Accession #:    [id number]  Weight:       206.0 lb Date of Birth:  16-Aug-1985   BSA:          2.025 m Patient Age:    35 years    BP:           107/39 mmHg Patient Gender: F           HR:           70 bpm. Exam Location:  Inpatient Procedure: 2D Echo, Color Doppler, Cardiac Doppler and Saline Contrast Bubble            Study Indications:    Stroke  History:        Patient has no prior history of Echocardiogram examinations.  Sonographer:    Melton Krebs RDCS, FE, PE Referring Phys: [phone] Gisel Vipond CALDWELL POWELL JR IMPRESSIONS  1. Left ventricular ejection fraction, by estimation, is 60 to 65%. The left ventricle has normal function. The left ventricle has no regional wall motion abnormalities. Left ventricular diastolic parameters were normal.  2. Right ventricular systolic function is normal. The right ventricular size is normal. There is normal pulmonary artery systolic pressure.  3. The mitral valve is normal in structure. No evidence of mitral valve regurgitation.  4. The aortic valve is tricuspid. Aortic valve regurgitation is not  visualized. No aortic stenosis is present.  5. The inferior vena cava is normal in size with greater than 50% respiratory variability, suggesting right atrial pressure of 3 mmHg.  6. Agitated saline contrast bubble study was positive with shunting observed after >6 cardiac cycles suggestive of intrapulmonary shunting. FINDINGS  Left Ventricle: Left ventricular ejection fraction, by estimation, is 60 to 65%. The left ventricle has normal function. The left ventricle has no regional wall motion abnormalities. The left ventricular internal cavity size was normal in size. There is  no left ventricular hypertrophy. Left ventricular diastolic parameters were normal. Right Ventricle: The right ventricular size is normal. No increase in right ventricular wall thickness. Right ventricular systolic function is normal. There is normal pulmonary artery systolic pressure. The tricuspid regurgitant velocity is 1.64 m/s, and  with an assumed right atrial pressure of 3 mmHg, the estimated right ventricular systolic pressure is 13.8 mmHg. Left Atrium: Left atrial size was normal in size. Right Atrium: Right atrial size was normal in size. Pericardium: There is no evidence of pericardial effusion. Mitral Valve: The mitral valve is normal in structure. No evidence of mitral valve regurgitation. Tricuspid Valve: The tricuspid valve is normal in structure. Tricuspid valve regurgitation is trivial. Aortic Valve: The aortic valve is tricuspid. Aortic valve regurgitation is not visualized. No aortic stenosis is present. Pulmonic Valve: The pulmonic valve was grossly normal. Pulmonic valve regurgitation is trivial. Aorta: The aortic root and ascending aorta are structurally normal, with no evidence of dilitation. Venous: The inferior vena cava is normal in size with greater than 50% respiratory variability, suggesting right atrial pressure of 3 mmHg. IAS/Shunts: The interatrial septum was not well visualized. Agitated saline contrast was  given intravenously to evaluate for intracardiac shunting. Agitated saline contrast bubble study was positive with shunting observed after >6 cardiac cycles suggestive of intrapulmonary shunting.  LEFT VENTRICLE PLAX 2D LVIDd:  4.60 cm   Diastology LVIDs:         2.90 cm   LV e' medial:    11.30 cm/s LV PW:         0.90 cm   LV E/e' medial:  5.8 LV IVS:        0.80 cm   LV e' lateral:   13.50 cm/s LVOT diam:     1.90 cm   LV E/e' lateral: 4.8 LV SV:         52 LV SV Index:   26 LVOT Area:     2.84 cm  RIGHT VENTRICLE RV S prime:     12.80 cm/s TAPSE (M-mode): 2.0 cm LEFT ATRIUM             Index        RIGHT ATRIUM           Index LA diam:        2.70 cm 1.33 cm/m   RA Area:     11.40 cm LA Vol (A2C):   25.6 ml 12.64 ml/m  RA Volume:   24.80 ml  12.25 ml/m LA Vol (A4C):   29.9 ml 14.76 ml/m LA Biplane Vol: 27.7 ml 13.68 ml/m  AORTIC VALVE LVOT Vmax:   95.80 cm/s LVOT Vmean:  68.500 cm/s LVOT VTI:    0.183 m  AORTA Ao Root diam: 2.80 cm Ao Asc diam:  2.60 cm MITRAL VALVE               TRICUSPID VALVE MV Area (PHT): 2.74 cm    TR Peak grad:   10.8 mmHg MV Decel Time: 277 msec    TR Vmax:        164.00 cm/s MV E velocity: 65.00 cm/s MV Maryclaire Stoecker velocity: 66.60 cm/s  SHUNTS MV E/Wing Gfeller ratio:  0.98        Systemic VTI:  0.18 m                            Systemic Diam: 1.90 cm Oswaldo Milian MD Electronically signed by Oswaldo Milian MD Signature Date/Time: 02/08/2022/12:07:40 PM    Final    CT ANGIO HEAD NECK W WO CM (CODE STROKE)  Result Date: 02/07/2022 CLINICAL DATA:  Left-sided numbness. 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 EXAM: CT ANGIOGRAPHY HEAD AND NECK TECHNIQUE: Multidetector CT imaging of the head and neck was performed using the standard protocol during bolus administration of intravenous contrast. Multiplanar CT image reconstructions and MIPs were obtained to evaluate the vascular anatomy. Carotid stenosis measurements (when applicable) are obtained  utilizing NASCET criteria, using the distal internal carotid diameter as the denominator. RADIATION DOSE REDUCTION: This exam was performed according to the departmental dose-optimization program which includes automated exposure control, adjustment of the mA and/or kV according to patient size and/or use of iterative reconstruction technique. CONTRAST:  50mL OMNIPAQUE IOHEXOL 350 MG/ML SOLN COMPARISON:  11/08/2021 FINDINGS: CTA NECK FINDINGS SKELETON: There is no bony spinal canal stenosis. No lytic or blastic lesion. OTHER NECK: Normal pharynx, larynx and major salivary glands. No cervical lymphadenopathy. Unremarkable thyroid gland. UPPER CHEST: No pneumothorax or pleural effusion. No nodules or masses. AORTIC ARCH: There is no calcific atherosclerosis of the aortic arch. There is no aneurysm, dissection or hemodynamically significant stenosis of the visualized portion of the aorta. Conventional 3 vessel aortic branching pattern. The visualized proximal subclavian arteries are widely patent. RIGHT CAROTID SYSTEM: Normal without  aneurysm, dissection or stenosis. LEFT CAROTID SYSTEM: Normal without aneurysm, dissection or stenosis. VERTEBRAL ARTERIES: Left dominant configuration. Both origins are clearly patent. There is no dissection, occlusion or flow-limiting stenosis to the skull base (V1-V3 segments). CTA HEAD FINDINGS POSTERIOR CIRCULATION: --Vertebral arteries: Normal V4 segments. --Inferior cerebellar arteries: Normal. --Basilar artery: Normal. --Superior cerebellar arteries: Normal. --Posterior cerebral arteries (PCA): Normal. ANTERIOR CIRCULATION: --Intracranial internal carotid arteries: Normal. --Anterior cerebral arteries (ACA): Status post coiling of Darel Ricketts-comm aneurysm. There remains Therma Lasure 3 mm aneurysm at the left A1/Atara Paterson-comm junction. There is also fusiform 2 mm dilatation of the Senai Kingsley-comm. Both of these are unchanged. --Middle cerebral arteries (MCA): Normal. VENOUS SINUSES: As permitted by contrast timing,  patent. ANATOMIC VARIANTS: None Review of the MIP images confirms the above findings. IMPRESSION: 1. No emergent large vessel occlusion or high-grade stenosis. 2. Status post coiling of dominant portion of Alvy Alsop-comm aneurysm. 3. Unchanged 3 mm aneurysm at the left A1/Nnenna Meador-comm junction and 2 mm fusiform Rolando Hessling-comm dilatation. Electronically Signed   By: Ulyses Jarred M.D.   On: 02/07/2022 23:18        Scheduled Meds:  aspirin EC  81 mg Oral Daily   atorvastatin  40 mg Oral Daily   clopidogrel  75 mg Oral Daily   sodium chloride flush  3 mL Intravenous Once   Continuous Infusions:   LOS: 1 day    Time spent: over 30 min    Fayrene Helper, MD [facility]   To contact the attending provider between 7A-7P or the covering provider during after hours 7P-7A, please log into the web site [email or web address] and access using universal [place] password for that web site. If you do not have the password, please call the hospital operator.  02/09/2022, 8:13 PM

## 2022-02-09 NOTE — Plan of Care (Signed)
  Problem: Education: Goal: Understanding of CV disease, CV risk reduction, and recovery process will improve Outcome: Not Applicable Goal: Individualized Educational Video(s) Outcome: Not Applicable   Problem: Activity: Goal: Ability to return to baseline activity level will improve Outcome: Not Applicable   Problem: Cardiovascular: Goal: Ability to achieve and maintain adequate cardiovascular perfusion will improve Outcome: Not Applicable Goal: Vascular access site(s) Level 0-1 will be maintained Outcome: Not Applicable   Problem: Health Behavior/Discharge Planning: Goal: Ability to safely manage health-related needs after discharge will improve Outcome: Not Applicable   Problem: Education: Goal: Knowledge of General Education information will improve Description: Including pain rating scale, medication(s)/side effects and non-pharmacologic comfort measures Outcome: Progressing   Problem: Health Behavior/Discharge Planning: Goal: Ability to manage health-related needs will improve Outcome: Progressing   Problem: Clinical Measurements: Goal: Ability to maintain clinical measurements within normal limits will improve Outcome: Progressing Goal: Will remain free from infection Outcome: Progressing Goal: Respiratory complications will improve Outcome: Not Applicable Goal: Cardiovascular complication will be avoided Outcome: Progressing   Problem: Activity: Goal: Risk for activity intolerance will decrease Outcome: Progressing   Problem: Nutrition: Goal: Adequate nutrition will be maintained Outcome: Progressing   Problem: Coping: Goal: Level of anxiety will decrease Outcome: Progressing   Problem: Elimination: Goal: Will not experience complications related to bowel motility Outcome: Progressing Goal: Will not experience complications related to urinary retention Outcome: Progressing   Problem: Pain Managment: Goal: General experience of comfort will  improve Outcome: Progressing   Problem: Safety: Goal: Ability to remain free from injury will improve Outcome: Progressing   Problem: Skin Integrity: Goal: Risk for impaired skin integrity will decrease Outcome: Progressing   Problem: Education: Goal: Knowledge of disease or condition will improve Outcome: Progressing Goal: Knowledge of secondary prevention will improve (SELECT ALL) Outcome: Progressing Goal: Knowledge of patient specific risk factors will improve (INDIVIDUALIZE FOR PATIENT) Outcome: Progressing   Problem: Coping: Goal: Will verbalize positive feelings about self Outcome: Progressing Goal: Will identify appropriate support needs Outcome: Progressing

## 2022-02-10 DIAGNOSIS — I6340 Cerebral infarction due to embolism of unspecified cerebral artery: Secondary | ICD-10-CM | POA: Diagnosis not present

## 2022-02-10 DIAGNOSIS — Z8679 Personal history of other diseases of the circulatory system: Secondary | ICD-10-CM | POA: Diagnosis not present

## 2022-02-10 DIAGNOSIS — I639 Cerebral infarction, unspecified: Secondary | ICD-10-CM | POA: Diagnosis not present

## 2022-02-10 LAB — PROTEIN S ACTIVITY: Protein S Activity: 73 % (ref 63–140)

## 2022-02-10 LAB — LUPUS ANTICOAGULANT PANEL
DRVVT: 37.1 s (ref 0.0–47.0)
PTT Lupus Anticoagulant: 32 s (ref 0.0–43.5)

## 2022-02-10 LAB — PROTEIN C ACTIVITY: Protein C Activity: 110 % (ref 73–180)

## 2022-02-10 LAB — PROTEIN C, TOTAL: Protein C, Total: 99 % (ref 60–150)

## 2022-02-10 LAB — PROTEIN S, TOTAL: Protein S Ag, Total: 101 % (ref 60–150)

## 2022-02-10 NOTE — Progress Notes (Addendum)
STROKE TEAM PROGRESS NOTE   INTERVAL HISTORY Daughter is at the bedside.  Patient lying in bed NAD.  Pending TEE. Discussed with Dr. Sherlon Handing IR about pt stroke on CT and MRI, she agrees that the stroke could be related to procedure but pt presenting symptoms did not fit to the stroke location. Agree with TEE for PFO evaluation.   Vitals:   02/10/22 0437 02/10/22 0758 02/10/22 1118 02/10/22 1601  BP: (!) 103/58 (!) 111/58 112/61 103/63  Pulse: 60 67 76 68  Resp: 17 16 15 16   Temp: 98.4 F (36.9 C) 98.4 F (36.9 C) 98.6 F (37 C) 98.4 F (36.9 C)  TempSrc: Oral Oral Oral Oral  SpO2: 100% 100% 100% 100%  Weight:      Height:       CBC:  Recent Labs  Lab 02/07/22 1704 02/07/22 1710 02/08/22 0459 02/09/22 0201  WBC 8.7  --  10.0 7.1  NEUTROABS 6.6  --   --  4.0  HGB 13.2   < > 13.8 12.3  HCT 42.0   < > 43.1 37.1  MCV 95.5  --  95.1 91.6  PLT 259  --  271 251   < > = values in this interval not displayed.   Basic Metabolic Panel:  Recent Labs  Lab 02/08/22 0459 02/09/22 0201  NA 140 138  K 4.3 3.7  CL 107 106  CO2 24 24  GLUCOSE 102* 108*  BUN 12 12  CREATININE 0.87 0.90  CALCIUM 9.0 8.5*  MG  --  2.0  PHOS  --  4.7*   Lipid Panel:  Recent Labs  Lab 02/08/22 0459  CHOL 151  TRIG 37  HDL 52  CHOLHDL 2.9  VLDL 7  LDLCALC 92   HgbA1c:  Recent Labs  Lab 02/08/22 0459  HGBA1C 4.9   Urine Drug Screen:  Recent Labs  Lab 02/08/22 0630  LABOPIA NONE DETECTED  COCAINSCRNUR NONE DETECTED  LABBENZ NONE DETECTED  AMPHETMU NONE DETECTED  THCU NONE DETECTED  LABBARB NONE DETECTED    Alcohol Level  Recent Labs  Lab 02/07/22 1704  ETH <10    IMAGING past 24 hours No results found.  PHYSICAL EXAM Constitutional: Appears well-developed and well-nourished.  Psych: Affect appropriate to situation Eyes: Normal external eye and conjunctiva. HENT: Normocephalic, no lesions, without obvious abnormality.   Musculoskeletal-no joint tenderness, deformity  or swelling Cardiovascular: Normal rate and regular rhythm.  Respiratory: Effort normal, non-labored breathing saturations WNL GI: Soft.  No distension. There is no tenderness.  Skin: WDI, patient has a red itchy rash on overall body.   Neuro:  Mental Status: Alert, oriented, thought content appropriate.  Speech fluent without evidence of aphasia.  Able to follow commands without difficulty. Cranial Nerves:  Visual fields grossly normal,  EOEMI, PERRL,smile symmetric, facial light touch sensation normal bilaterally hearing normal bilaterally  midline tongue extension Motor/Sensory: Right : Upper extremity   5/5  Left:     Upper extremity   5/5  Lower extremity   5/5   Lower extremity   5/5 Tone and bulk:normal tone throughout; no atrophy noted light touch intact throughout, bilaterally Cerebellar: normal finger-to-nose,  Gait: not tested     ASSESSMENT/PLAN Debbie Armstrong is a 36 y.o. 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 01/09/22. de [place] Bouchard.  She ws neurologically intact without deficits at discharge. Most recently she underwent diagnostic anioggram with D. Melchor Amour 3 days ago on July 6 without difficulty and was discharged home. Today patient was at her baseline until while driving at [id number] she first began to notice numbness/tingling in her left hand which progressed to numbness/tingling in her left face along with lightheadedness over the next 15 minutes which did not resolve thus prompting her to report to ED where Code Stroke was activated. CT revealed right cerebellar acute infarct.   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 However, pt right VA quite hypoplastic and left VA access only during angio should not cause significant right cerebellar infarct.  Could be PFO in young - embolic pattern with PFO, ROPE score high, however, symptoms not fit to the stroke area although right brain TIA can not excluded  Left hand, arm and face numbness, acute onset, lasted 2.5 hours. No HA, no hx of migraine.  Code Stroke CT head probably right subacute cerebellar infarct. No acute hemorrhage.  CTA head & neck no LVO MRI  Multiple acute right cerebellar infarcts. Additional punctate acute infarcts in the left cerebellum and lateral left temporal lobe. Probable prior small white matter infarcts in the left corona radiata 2D Echo 60 to 65%, positive for PFO  BLE dopplers no DVT TEE pending Hypercoagulable labs so far neg, rest pending LDL 92 HgbA1c 4.9 VTE prophylaxis - SCD's No antithrombotic prior to admission, now on aspirin 81 mg daily and plavix DAPT.  Therapy recommendations:  none Disposition:  pending  PFO TTE showed positive PFO TEE pending ROPE score = 9 On ASA and plavix now Will refer to Dr. 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  CT showed bilateral SAH.  CT head and neck showed ACOM aneurysm but also left ICA supplies left PCA stroke P-comm, left MCA and ACA supplied from right ICA via ACOM.  Had IR again showed 3 small ACOM aneurysms status post coiling, and also 8mm paraophthalmic aneurysm.  Discharged home without residual 02/04/2022 had cerebral angio showed stable coiled aneurysm, stable 3.5x2.6 right ICA ophthalmic aneurysm. No reported symptoms after procedure except rash and itchiness that night.   BP management Home meds:  none Stable Long-term BP goal normotensive  Hyperlipidemia Home meds:  none  LDL 92, goal < 70 Add atorvastatin 40 mg daily  Continue  statin at discharge  Other Stroke Risk Factors Obesity, Body mass index is 34.34 kg/m., BMI >/= 30 associated with increased stroke risk, recommend weight loss, diet and exercise as appropriate   Other active issues Iodine contrast allergy - body rash and itchiness, status post Benadryl  Hospital day # 2  I have discussed with Dr. Sherlon Handing interventional radiologist extensively.  Marvel Plan, MD PhD Stroke Neurology 02/10/2022 6:00 PM  To contact Stroke Continuity provider, please refer to [email or web address]. After hours, contact General Neurology

## 2022-02-10 NOTE — Progress Notes (Signed)
PROGRESS NOTE    Debbie Armstrong  YIR:[id number] DOB: September 22, 1985 DOA: 02/07/2022 PCP: Pcp, No  Chief Complaint  Patient presents with   Code Stroke    Brief Narrative:  Debbie Armstrong is Debbie Armstrong 36 y.o. 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  Incidental finding of acute right cerebellar infarct on CT head.  MRI also with strokes.  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   MRI brain with multiple acute R cerebellar infarcts, additional punctate acute infarcts in L cerebellum and lateral L temporal lobe, mild edema without mass effect.  Probable prior small white matter infarcts in L corona radiata.  CTA head/neck without emergent LVO or high grade stenosis, s/p coiling of dominant portion of [place] comm aneurysm, unchanged 3 mm aneurysm at the L A1/[employer]mer Aliberti-comm junction and 2 mm fusiform Verlan Grotz-comm dilatation Stroke related to recent angiogram? Needs comprehensive w/u given age.  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 Tommie Sams.  Was also noted to have right ICA ophthalmic  segment aneurysm at the time.  Repeat cerebral angiogram 02/04/2022 showed complete occlusion of the previously coiled small anterior communicating artery aneurysms without evidence of coil compaction.  Contrast media adverse reaction Developed rash, hives after contrast Added to allergy list Antihistamine prn No indication for epi at this time  Obesity (BMI 30-39.9) Noted      DVT prophylaxis: SCD Code Status: full Family Communication: daughter at bedside Disposition:   Status is: Observation The patient will require care spanning > 2 midnights and should be moved to inpatient because: pending stroke workup completion.  Important that her workup is done inpatient given age, positive bubble study, multiple infarcts on imaging.  Discussed with neurology who recommended TEE be completed inpatient given her age.   Consultants:  neurology  Procedures:  IMPRESSIONS     1. Left ventricular ejection fraction, by estimation, is 60 to 65%. The  left ventricle has normal function. The left ventricle has no regional  wall motion abnormalities. Left ventricular diastolic parameters were  normal.   2. Right ventricular systolic function is normal. The right ventricular  size is normal. There is normal pulmonary artery systolic pressure.   3. The mitral valve is normal in structure. No evidence of mitral valve  regurgitation.   4. The aortic valve is tricuspid. Aortic valve regurgitation is not  visualized. No aortic stenosis is present.   5. The inferior vena cava is normal in size with greater than 50%  respiratory variability, suggesting right atrial pressure of 3 mmHg.   6. Agitated saline contrast bubble study was positive with shunting  observed after >6 cardiac cycles suggestive of intrapulmonary  shunting.   Summary:  BILATERAL:  - No evidence of deep vein thrombosis seen in the lower extremities,  bilaterally.  -No evidence of popliteal cyst, bilaterally  Antimicrobials:   Anti-infectives (From admission, onward)    None       Subjective: No complaints Daughter at bedsdie  Objective: Vitals:   02/10/22 0437 02/10/22 0758 02/10/22 1118 02/10/22 1601  BP: (!) 103/58 (!) 111/58 112/61 103/63  Pulse: 60 67 76 68  Resp: 17 16 15 16   Temp: 98.4 F (36.9 C) 98.4 F (36.9 C) 98.6 F (37 C) 98.4 F (36.9 C)  TempSrc: Oral Oral Oral Oral  SpO2: 100% 100% 100% 100%  Weight:      Height:        Intake/Output Summary (Last 24 hours) at 02/10/2022 1658 Last data filed at 02/10/2022 0800 Gross per 24 hour  Intake 120 ml  Output --  Net 120 ml   Filed Weights   02/08/22 0813 02/08/22 2049  Weight: 93.4 kg 96.5 kg    Examination:  General: No acute distress. Lungs: unlabored Neurological: Alert and oriented 3. Moves all extremities 4 . Cranial nerves II through XII grossly intact.  Data Reviewed: I have personally reviewed following labs and imaging studies  CBC: Recent Labs  Lab 02/04/22 0726 02/07/22 1704 02/07/22 1710 02/08/22 0459 02/09/22 0201  WBC 6.2 8.7  --  10.0 7.1  NEUTROABS  --  6.6  --   --  4.0  HGB 12.6 13.2 13.9 13.8 12.3  HCT 39.5 42.0 41.0 43.1 37.1  MCV 93.6 95.5  --  95.1 91.6  PLT 264 259  --  271 251    Basic Metabolic Panel: Recent Labs  Lab 02/04/22 0726 02/07/22 1704 02/07/22 1710 02/08/22 0459 02/09/22 0201  NA 140 138 138 140 138  K 4.0 4.0 3.9 4.3 3.7  CL 110 105 104 107 106  CO2 24 24  --  24 24  GLUCOSE 93 96 90 102* 108*  BUN 20 9 9 12 12   CREATININE 0.85 0.76 0.70 0.87 0.90  CALCIUM 8.8* 8.8*  --  9.0 8.5*  MG  --   --   --   --  2.0  PHOS  --   --   --   --  4.7*    GFR: Estimated Creatinine Clearance: 101.2 mL/min (by C-G formula based on SCr of 0.9 mg/dL).  Liver Function Tests: Recent Labs  Lab 02/07/22 1704 02/09/22 0201  AST 24 17  ALT 23 18  ALKPHOS 79 71  BILITOT 0.4 0.4  PROT 6.6 5.2*  ALBUMIN 3.4* 2.8*    CBG: Recent Labs  Lab 02/07/22 1703  GLUCAP 91      No results found for this or any previous visit (from the past 240 hour(s)).       Radiology Studies: No results found.      Scheduled Meds:  aspirin EC  81 mg Oral Daily   atorvastatin  40 mg Oral Daily   clopidogrel  75 mg Oral Daily   sodium chloride flush  3 mL Intravenous Once   Continuous Infusions:   LOS: 2 days    Time spent: over 30 min    04/12/22, MD [facility]   To contact the attending provider between 7A-7P or the covering provider during after hours 7P-7A, please log into the web site [email or web address] and access using universal [place] password for that web site. If you do not have the password,  please call the hospital operator.  02/10/2022, 4:58 PM

## 2022-02-10 NOTE — Progress Notes (Signed)
   [facility] has been requested to perform a transesophageal echocardiogram on [facility] for stroke.  After careful review of history and examination, the risks and benefits of transesophageal echocardiogram have been explained including risks of esophageal damage, perforation (1:10,000 risk), bleeding, pharyngeal hematoma as well as other potential complications associated with conscious sedation including aspiration, arrhythmia, respiratory failure and death. Alternatives to treatment were discussed, questions were answered. Patient is willing to proceed.   Pt scheduled 0830 on 02/11/22 with Dr. Cristal Deer. NPO at [facility] tonight please.  Roe Rutherford Anabelen Kaminsky, [place]  02/10/2022 4:09 PM

## 2022-02-10 NOTE — Progress Notes (Addendum)
STROKE TEAM PROGRESS NOTE   INTERVAL HISTORY Daughter is at the bedside.  Patient lying in bed NAD.  Pending TEE.  Vitals:   02/09/22 0804 02/09/22 1100 02/09/22 1958 02/10/22 0017  BP: (!) 114/57 127/67 111/70 133/71  Pulse: (!) 58 60 64 67  Resp: 16 16 16 17   Temp: 98.6 F (37 C) 98.1 F (36.7 C) 98.5 F (36.9 C) 98.4 F (36.9 C)  TempSrc: Oral Oral Oral Oral  SpO2: 99% 98% 100% 100%  Weight:      Height:       CBC:  Recent Labs  Lab 02/07/22 1704 02/07/22 1710 02/08/22 0459 02/09/22 0201  WBC 8.7  --  10.0 7.1  NEUTROABS 6.6  --   --  4.0  HGB 13.2   < > 13.8 12.3  HCT 42.0   < > 43.1 37.1  MCV 95.5  --  95.1 91.6  PLT 259  --  271 251   < > = values in this interval not displayed.   Basic Metabolic Panel:  Recent Labs  Lab 02/08/22 0459 02/09/22 0201  NA 140 138  K 4.3 3.7  CL 107 106  CO2 24 24  GLUCOSE 102* 108*  BUN 12 12  CREATININE 0.87 0.90  CALCIUM 9.0 8.5*  MG  --  2.0  PHOS  --  4.7*   Lipid Panel:  Recent Labs  Lab 02/08/22 0459  CHOL 151  TRIG 37  HDL 52  CHOLHDL 2.9  VLDL 7  LDLCALC 92   HgbA1c:  Recent Labs  Lab 02/08/22 0459  HGBA1C 4.9   Urine Drug Screen:  Recent Labs  Lab 02/08/22 0630  LABOPIA NONE DETECTED  COCAINSCRNUR NONE DETECTED  LABBENZ NONE DETECTED  AMPHETMU NONE DETECTED  THCU NONE DETECTED  LABBARB NONE DETECTED    Alcohol Level  Recent Labs  Lab 02/07/22 1704  ETH <10    IMAGING past 24 hours No results found.  PHYSICAL EXAM Constitutional: Appears well-developed and well-nourished.  Psych: Affect appropriate to situation Eyes: Normal external eye and conjunctiva. HENT: Normocephalic, no lesions, without obvious abnormality.   Musculoskeletal-no joint tenderness, deformity or swelling Cardiovascular: Normal rate and regular rhythm.  Respiratory: Effort normal, non-labored breathing saturations WNL GI: Soft.  No distension. There is no tenderness.  Skin: WDI, patient has a red itchy rash  on overall body.   Neuro:  Mental Status: Alert, oriented, thought content appropriate.  Speech fluent without evidence of aphasia.  Able to follow commands without difficulty. Cranial Nerves:  Visual fields grossly normal,  EOEMI, PERRL,smile symmetric, facial light touch sensation normal bilaterally hearing normal bilaterally  midline tongue extension Motor/Sensory: Right : Upper extremity   5/5  Left:     Upper extremity   5/5  Lower extremity   5/5   Lower extremity   5/5 Tone and bulk:normal tone throughout; no atrophy noted light touch intact throughout, bilaterally Cerebellar: normal finger-to-nose,  Gait: not tested     ASSESSMENT/PLAN Debbie Armstrong is a 36 y.o. 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 01/09/22. de [place] Bouchard. She ws neurologically intact without deficits at discharge. Most recently she underwent diagnostic anioggram with D. Melchor Amour 3 days ago on July 6 without difficulty and was discharged home. Today patient was at her baseline until while driving at  1415 she first began to notice numbness/tingling in her left hand which progressed to numbness/tingling in her left face along with lightheadedness over the next 15 minutes which did not resolve thus prompting her to report to ED where Code Stroke was activated. CT revealed right cerebellar acute infarct.   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 However, pt right VA quite hypoplastic and left VA access only during angio should not cause significant right cerebellar infarct.  Could  be PFO in young - embolic pattern with PFO, ROPE score high, however, symptoms not fit to the stroke area although right brain TIA can not excluded  Left hand, arm and face numbness, acute onset, lasted 2.5 hours. No HA, no hx of migraine.  Code Stroke CT head probably right subacute cerebellar infarct. No acute hemorrhage.  CTA head & neck no LVO MRI  Multiple acute right cerebellar infarcts. Additional punctate acute infarcts in the left cerebellum and lateral left temporal lobe. Probable prior small white matter infarcts in the left corona radiata 2D Echo 60 to 65%, positive for PFO  BLE dopplers no DVT TEE pending Hypercoagulable labs: pending  LDL 92 HgbA1c 4.9 VTE prophylaxis - SCD's No antithrombotic prior to admission, now on aspirin 81 mg daily and plavix DAPT.  Therapy recommendations:  none Disposition:  pending  PFO TTE showed positive PFO TEE pending ROPE score = 9 On ASA and plavix now May need to discuss with Dr. Excell Seltzer  Cerebral aneurysm s/p coiling 10/2021 admitted for headache.  CT showed bilateral SAH.  CT head and neck showed ACOM aneurysm but also left ICA supplies left PCA stroke P-comm, left MCA and ACA supplied from right ICA via ACOM.  Had IR again showed 3 small ACOM aneurysms status post coiling, and also 80mm paraophthalmic aneurysm.  Discharged home without residual 02/04/2022 had cerebral angio showed stable coiled aneurysm, stable 3.5x2.6 right ICA ophthalmic aneurysm. No reported symptoms after procedure except rash and itchiness that night.   BP management Home meds:  none Stable Long-term BP goal normotensive  Hyperlipidemia Home meds:  none  LDL 92, goal < 70 Add atorvastatin 40 mg daily  Continue statin at discharge  Other Stroke Risk Factors Obesity, Body mass index is 34.34 kg/m., BMI >/= 30 associated with increased stroke risk, recommend weight loss, diet and exercise as appropriate   Other active issues Iodine contrast allergy - body  rash and itchiness, status post Benadryl  Hospital day # 2   Marvel Plan, MD PhD Stroke Neurology 02/10/2022 12:23 AM  To contact Stroke Continuity provider, please refer to [email or web address]. After hours, contact General Neurology

## 2022-02-11 ENCOUNTER — Inpatient Hospital Stay (HOSPITAL_COMMUNITY): Payer: BC Managed Care – PPO | Admitting: Anesthesiology

## 2022-02-11 ENCOUNTER — Encounter (HOSPITAL_COMMUNITY): Payer: Self-pay | Admitting: Family Medicine

## 2022-02-11 ENCOUNTER — Encounter (HOSPITAL_COMMUNITY)
Admission: EM | Disposition: A | Discharge status: Home / Self Care | Payer: Self-pay | Source: Home / Self Care | Attending: Family Medicine

## 2022-02-11 ENCOUNTER — Inpatient Hospital Stay (HOSPITAL_COMMUNITY): Payer: BC Managed Care – PPO

## 2022-02-11 ENCOUNTER — Other Ambulatory Visit: Payer: Self-pay

## 2022-02-11 DIAGNOSIS — E669 Obesity, unspecified: Secondary | ICD-10-CM | POA: Diagnosis not present

## 2022-02-11 DIAGNOSIS — Z8679 Personal history of other diseases of the circulatory system: Secondary | ICD-10-CM | POA: Diagnosis not present

## 2022-02-11 DIAGNOSIS — G43109 Migraine with aura, not intractable, without status migrainosus: Secondary | ICD-10-CM

## 2022-02-11 DIAGNOSIS — I639 Cerebral infarction, unspecified: Secondary | ICD-10-CM

## 2022-02-11 DIAGNOSIS — T508X5A Adverse effect of diagnostic agents, initial encounter: Secondary | ICD-10-CM | POA: Diagnosis not present

## 2022-02-11 DIAGNOSIS — I97821 Postprocedural cerebrovascular infarction during other surgery: Secondary | ICD-10-CM

## 2022-02-11 DIAGNOSIS — I6340 Cerebral infarction due to embolism of unspecified cerebral artery: Secondary | ICD-10-CM | POA: Diagnosis not present

## 2022-02-11 HISTORY — PX: BUBBLE STUDY: SHX6837

## 2022-02-11 HISTORY — PX: TEE WITHOUT CARDIOVERSION: SHX5443

## 2022-02-11 LAB — BASIC METABOLIC PANEL
Anion gap: 3 — ABNORMAL LOW (ref 5–15)
BUN: 13 mg/dL (ref 6–20)
CO2: 25 mmol/L (ref 22–32)
Calcium: 8.7 mg/dL — ABNORMAL LOW (ref 8.9–10.3)
Chloride: 110 mmol/L (ref 98–111)
Creatinine, Ser: 0.7 mg/dL (ref 0.44–1.00)
GFR, Estimated: >60 mL/min (ref >60)
Glucose, Bld: 92 mg/dL (ref 70–99)
Potassium: 4.1 mmol/L (ref 3.5–5.1)
Sodium: 138 mmol/L (ref 135–145)

## 2022-02-11 SURGERY — ECHOCARDIOGRAM, TRANSESOPHAGEAL
Anesthesia: Monitor Anesthesia Care

## 2022-02-11 MED ORDER — EPHEDRINE SULFATE (PRESSORS) 50 MG/ML IJ SOLN
INTRAMUSCULAR | Status: DC | PRN
  Administered 2022-02-11: 10 mg via INTRAVENOUS

## 2022-02-11 MED ORDER — PROPOFOL 500 MG/50ML IV EMUL
INTRAVENOUS | Status: DC | PRN
  Administered 2022-02-11: 150 ug/kg/min via INTRAVENOUS

## 2022-02-11 MED ORDER — PROPOFOL 10 MG/ML IV BOLUS
INTRAVENOUS | Status: DC | PRN
  Administered 2022-02-11 (×3): 30 mg via INTRAVENOUS

## 2022-02-11 MED ORDER — PHENYLEPHRINE 80 MCG/ML (10ML) SYRINGE FOR IV PUSH (FOR BLOOD PRESSURE SUPPORT)
PREFILLED_SYRINGE | INTRAVENOUS | Status: DC | PRN
  Administered 2022-02-11: 80 ug via INTRAVENOUS

## 2022-02-11 MED ORDER — SODIUM CHLORIDE 0.9 % IV SOLN: INTRAVENOUS | Status: DC

## 2022-02-11 MED ORDER — ASPIRIN 81 MG PO TBEC: 81.0000 mg | DELAYED_RELEASE_TABLET | Freq: Every day | ORAL | 12 refills | Status: DC

## 2022-02-11 NOTE — Progress Notes (Signed)
STROKE TEAM PROGRESS NOTE   INTERVAL HISTORY Daughter is at the bedside.  Patient lying in bed NAD.  Pending TEE. Discussed with Dr. Sherlon Handing IR about pt stroke on CT and MRI, she agrees that the stroke could be related to procedure but pt presenting symptoms did not fit to the stroke location. Agree with TEE for PFO evaluation.   Vitals:   02/11/22 0900 02/11/22 0908 02/11/22 1015 02/11/22 1110  BP: 111/61 93/78 115/81 119/65  Pulse: 79 91 83 83  Resp: 20 (!) 21 16 18   Temp:  99.2 F (37.3 C) 98.8 F (37.1 C) 98.5 F (36.9 C)  TempSrc:   Oral Oral  SpO2: 100% 100% 100% 100%  Weight:      Height:       CBC:  Recent Labs  Lab 02/07/22 1704 02/07/22 1710 02/08/22 0459 02/09/22 0201  WBC 8.7  --  10.0 7.1  NEUTROABS 6.6  --   --  4.0  HGB 13.2   < > 13.8 12.3  HCT 42.0   < > 43.1 37.1  MCV 95.5  --  95.1 91.6  PLT 259  --  271 251   < > = values in this interval not displayed.   Basic Metabolic Panel:  Recent Labs  Lab 02/09/22 0201 02/11/22 0139  NA 138 138  K 3.7 4.1  CL 106 110  CO2 24 25  GLUCOSE 108* 92  BUN 12 13  CREATININE 0.90 0.70  CALCIUM 8.5* 8.7*  MG 2.0  --   PHOS 4.7*  --    Lipid Panel:  Recent Labs  Lab 02/08/22 0459  CHOL 151  TRIG 37  HDL 52  CHOLHDL 2.9  VLDL 7  LDLCALC 92   HgbA1c:  Recent Labs  Lab 02/08/22 0459  HGBA1C 4.9   Urine Drug Screen:  Recent Labs  Lab 02/08/22 0630  LABOPIA NONE DETECTED  COCAINSCRNUR NONE DETECTED  LABBENZ NONE DETECTED  AMPHETMU NONE DETECTED  THCU NONE DETECTED  LABBARB NONE DETECTED    Alcohol Level  Recent Labs  Lab 02/07/22 1704  ETH <10    IMAGING past 24 hours No results found.  PHYSICAL EXAM Constitutional: Appears well-developed and well-nourished.  Psych: Affect appropriate to situation Eyes: Normal external eye and conjunctiva. HENT: Normocephalic, no lesions, without obvious abnormality.   Musculoskeletal-no joint tenderness, deformity or swelling Cardiovascular:  Normal rate and regular rhythm.  Respiratory: Effort normal, non-labored breathing saturations WNL GI: Soft.  No distension. There is no tenderness.  Skin: WDI, patient has a red itchy rash on overall body.   Neuro:  Mental Status: Alert, oriented, thought content appropriate.  Speech fluent without evidence of aphasia.  Able to follow commands without difficulty. Cranial Nerves:  Visual fields grossly normal,  EOEMI, PERRL,smile symmetric, facial light touch sensation normal bilaterally hearing normal bilaterally  midline tongue extension Motor/Sensory: Right : Upper extremity   5/5  Left:     Upper extremity   5/5  Lower extremity   5/5   Lower extremity   5/5 Tone and bulk:normal tone throughout; no atrophy noted light touch intact throughout, bilaterally Cerebellar: normal finger-to-nose,  Gait: not tested     ASSESSMENT/PLAN Debbie Armstrong is a 36 y.o. 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 01/09/22. de Kirtland Bouchard. She ws neurologically intact  without deficits at discharge. Most recently she underwent diagnostic anioggram with D. Melchor Amour 3 days ago on July 6 without difficulty and was discharged home. Today patient was at her baseline until while driving at [id number] she first began to notice numbness/tingling in her left hand which progressed to numbness/tingling in her left face along with lightheadedness over the next 15 minutes which did not resolve thus prompting her to report to ED where Code Stroke was activated. CT revealed right cerebellar acute infarct.   ? Migraine equivalent  Episode of left hand, arm and face numbness, acute onset, lasted 2.5 hours.  4 days after the procedure Symptoms do not fit to the stroke found on the MRI and CT No HA, no hx of migraine.  Work up so  far negative Less likely seizure or panic attack On ASA 81. Will refer to Dr. 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 No acute hemorrhage.  CTA head & neck no LVO MRI  Multiple acute right cerebellar infarcts. Additional punctate acute infarcts in the left cerebellum and lateral left temporal lobe. 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  CT showed bilateral SAH.  CT head and neck showed ACOM aneurysm but also left ICA supplies left PCA stroke P-comm, left MCA and ACA supplied from right ICA via ACOM.  Had IR again showed 3 small ACOM aneurysms status post coiling, and also 96mm paraophthalmic aneurysm.  Discharged home without residual 02/04/2022 had cerebral angio showed stable coiled aneurysm, stable 3.5x2.6 right ICA ophthalmic aneurysm. No reported symptoms after procedure except rash and itchiness that night.   BP management Home meds:  none Stable Long-term BP goal normotensive  Lipid management Home meds:  none  LDL 92, goal < 100 No need statin given LDL at goal and stroke is procedure related  Other Stroke Risk Factors Obesity, Body mass index is 34.34 kg/m., BMI >/= 30 associated with increased stroke risk, recommend weight loss, diet and exercise as appropriate   Other active issues Iodine contrast allergy - body rash and itchiness, status post Benadryl  Hospital day # 3  Neurology will sign off. Please  call with questions. Pt will follow up with Dr. Delena Bali at [facility] in about 4 weeks. Thanks for the consult.   Marvel Plan, MD PhD Stroke Neurology 02/11/2022 12:35 PM  To contact Stroke Continuity provider, please refer to [email or web address]. After hours, contact General Neurology

## 2022-02-11 NOTE — Progress Notes (Signed)
Nsg Discharge Note  Admit Date:  02/07/2022 Discharge date: 02/11/2022   Debbie Armstrong to be D/C'd Home per MD order.  AVS completed. Patient/caregiver able to verbalize understanding.  Discharge Medication: Allergies as of 02/11/2022       Reactions   Iodinated Contrast Media Hives, Itching        Medication List     TAKE these medications    acetaminophen 500 MG tablet Commonly known as: TYLENOL Take 1,000 mg by mouth every 6 (six) hours as needed for moderate pain.   aspirin EC 81 MG tablet Take 1 tablet (81 mg total) by mouth daily. Swallow whole.   Hair Skin & Nails Advanced Tabs Take 1 tablet by mouth daily.        Discharge Assessment: Vitals:   02/11/22 1015 02/11/22 1110  BP: 115/81 119/65  Pulse: 83 83  Resp: 16 18  Temp: 98.8 F (37.1 C) 98.5 F (36.9 C)  SpO2: 100% 100%   Skin clean, dry and intact without evidence of skin break down, no evidence of skin tears noted. IV catheter discontinued intact. Site without signs and symptoms of complications - no redness or edema noted at insertion site, patient denies c/o pain - only slight tenderness at site.  Dressing with slight pressure applied.  D/c Instructions-Education: Discharge instructions given to patient/family with verbalized understanding. D/c education completed with patient/family including follow up instructions, medication list, d/c activities limitations if indicated, with other d/c instructions as indicated by MD - patient able to verbalize understanding, all questions fully answered. Patient instructed to return to ED, call 911, or call MD for any changes in condition.  Patient escorted via WC, and D/C home via private auto.  Kizzie Bane, RN 02/11/2022 12:34 PM

## 2022-02-11 NOTE — TOC Transition Note (Signed)
Transition of Care [facility]) - CM/SW Discharge Note   Patient Details  Name: Debbie Armstrong MRN: [id number] Date of Birth: April 24, 1986  Transition of [facility] CM/SW Contact:  Kermit Balo, RN Phone Number: 02/11/2022, 11:24 AM   Clinical Narrative:    Pt discharging home with self care. No needs per TOC.   Final next level of care: Home/Self Care Barriers to Discharge: No Barriers Identified   Patient Goals and CMS Choice        Discharge Placement                       Discharge Plan and Services   Discharge Planning Services: CM Consult                                 Social Determinants of Health (SDOH) Interventions     Readmission Risk Interventions     No data to display

## 2022-02-11 NOTE — Progress Notes (Signed)
  Echocardiogram Echocardiogram Transesophageal has been performed.  Augustine Radar 02/11/2022, 9:13 AM

## 2022-02-11 NOTE — Transfer of Care (Signed)
Immediate Anesthesia Transfer of Care Note  Patient: Debbie Armstrong  Procedure(s) Performed: TRANSESOPHAGEAL ECHOCARDIOGRAM (TEE) BUBBLE STUDY  Patient Location: PACU  Anesthesia Type:MAC  Level of Consciousness: drowsy  Airway & Oxygen Therapy: Patient Spontanous Breathing  Post-op Assessment: Report given to RN and Post -op Vital signs reviewed and stable  Post vital signs: Reviewed and stable  Last Vitals:  Vitals Value Taken Time  BP 93/78 02/11/22 0908  Temp 37.3 C 02/11/22 0908  Pulse 89 02/11/22 0909  Resp 18 02/11/22 0909  SpO2 100 % 02/11/22 0909  Vitals shown include unvalidated device data.  Last Pain:  Vitals:   02/11/22 0908  TempSrc:   PainSc: 0-No pain         Complications: No notable events documented.

## 2022-02-11 NOTE — CV Procedure (Signed)
    TRANSESOPHAGEAL ECHOCARDIOGRAM   NAME:  Debbie Armstrong   MRN: [id number] DOB:  [birth year]   ADMIT DATE: 02/07/2022  INDICATIONS: CVA  PROCEDURE:   Informed consent was obtained prior to the procedure. The risks, benefits and alternatives for the procedure were discussed and the patient comprehended these risks.  Risks include, but are not limited to, cough, sore throat, vomiting, nausea, somnolence, esophageal and stomach trauma or perforation, bleeding, low blood pressure, aspiration, pneumonia, infection, trauma to the teeth and death.    Procedural time out performed.   Patient received monitored anesthesia care under the supervision of Dr. Tacy Dura. Patient received a total of 336 mg propofol during the procedure. Also received 10 mg ephedrine and 80 mcg phenylephrine.  The transesophageal probe was inserted in the esophagus and stomach without difficulty and multiple views were obtained.    COMPLICATIONS:    There were no immediate complications.  FINDINGS:  LEFT VENTRICLE: EF = 60-65%. No regional wall motion abnormalities.  RIGHT VENTRICLE: Normal size and function.   LEFT ATRIUM: No thrombus/mass.  LEFT ATRIAL APPENDAGE: No thrombus/mass.   RIGHT ATRIUM: No thrombus/mass.  AORTIC VALVE:  Trileaflet. No regurgitation. No vegetation.  MITRAL VALVE:    Normal structure. No significant regurgitation. No vegetation.  TRICUSPID VALVE: Normal structure. No significant regurgitation. No vegetation.  PULMONIC VALVE: Grossly normal structure. No regurgitation. No apparent vegetation.  INTERATRIAL SEPTUM: No PFO or ASD seen by color Doppler. Agitated saline was given, with no intra-atrial shunting. Late bubble was positive, suggests possible intrapulmonary shunting.  PERICARDIUM: No effusion noted.  DESCENDING AORTA: No plaque seen   CONCLUSION: No cardiac source of embolism. No PFO/ASD by color, and bubble study was late positive. Suggests no intra-atrial shunting but may  have intrapulmonary shunting.   Jodelle Red, MD, PhD, [facility]  [facility]    Heart & Vascular at [facility] at [facility] [address], [address] [place], [place] [address] [phone]   8:50 AM

## 2022-02-11 NOTE — Anesthesia Postprocedure Evaluation (Signed)
Anesthesia Post Note  Patient: Debbie Armstrong  Procedure(s) Performed: TRANSESOPHAGEAL ECHOCARDIOGRAM (TEE) BUBBLE STUDY     Patient location during evaluation: PACU Anesthesia Type: MAC Level of consciousness: awake and alert Pain management: pain level controlled Vital Signs Assessment: post-procedure vital signs reviewed and stable Respiratory status: spontaneous breathing, nonlabored ventilation, respiratory function stable and patient connected to nasal cannula oxygen Cardiovascular status: stable and blood pressure returned to baseline Postop Assessment: no apparent nausea or vomiting Anesthetic complications: no   No notable events documented.  Last Vitals:  Vitals:   02/11/22 1015 02/11/22 1110  BP: 115/81 119/65  Pulse: 83 83  Resp: 16 18  Temp: 37.1 C 36.9 C  SpO2: 100% 100%    Last Pain:  Vitals:   02/11/22 1110  TempSrc: Oral  PainSc:                  Fender Herder

## 2022-02-11 NOTE — Discharge Summary (Signed)
Physician Discharge Summary  Debbie Armstrong DGL:[id number] DOB: 1986-06-16 DOA: 02/07/2022  PCP: Pcp, No  Admit date: 02/07/2022 Discharge date: 02/11/2022  Time spent: 30 minutes  Recommendations for Outpatient Follow-up:  Follow outpatient CBC/CMP  Follow pending hypercoagulable tests Follow with neurology outpatient Follow with neuro IR outpatient    Discharge Diagnoses:  Principal Problem:   Stroke 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  Incidental finding of acute right cerebellar infarct on CT head.  MRI also with strokes.  Seen by neurology.  Suspected periprocedural stroke.  Recommended aspirin alone and outpatient follow up.  See below for additional details  Hospital Course:  Assessment and Plan: * Stroke [facility]) New left-sided numbness involving lower face, LUE, left thigh beginning 02/07/2022.   MRI brain with multiple acute R cerebellar infarcts, additional punctate acute infarcts in L cerebellum and lateral L temporal lobe, mild edema without mass effect.  Probable prior small white matter infarcts in L corona radiata.  CTA head/neck without emergent LVO or high grade stenosis, s/p coiling of dominant portion of Idali Lafever comm aneurysm, unchanged 3 mm aneurysm at the L A1/Sherilyn Windhorst-comm junction and 2 mm fusiform Jeffren Dombek-comm dilatation Echo with positive bubble study (suggestive of  intrapulmonary shunting), normal EF 60-65%, RVSF normal  Negative LE [place] TEE without cardiac source of embolism, no PFO/ASD by color and bubble study late positive.  No intraatrial shunting, may have intrapulmonary shunting. Pending hypercoagulable labs a1c 4.9, LDL 92 Aspirin alone.  No need for statin per neuro given periprocedural stroke. 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 Karenann Cai.  Was also noted to have right ICA ophthalmic segment aneurysm at the time.  Repeat cerebral angiogram 02/04/2022 showed complete occlusion of the previously coiled small anterior communicating artery aneurysms without evidence of coil compaction.  Contrast media adverse reaction Developed rash, hives after contrast Added to allergy list Antihistamine prn No indication for epi at this time  Obesity (BMI 30-39.9) Noted      Procedures: LE [place] Summary:  BILATERAL:  - No evidence of deep vein thrombosis seen in the lower extremities,  bilaterally.  -No evidence of popliteal cyst, bilaterally.     Echo  IMPRESSIONS     1. Left ventricular ejection fraction, by estimation, is 60 to 65%. The  left ventricle has normal function. The left ventricle has no regional  wall motion abnormalities. Left ventricular diastolic parameters were  normal.   2. Right ventricular systolic function is normal. The right ventricular  size is normal. There is normal pulmonary artery systolic pressure.   3. The mitral valve is normal in structure. No evidence of mitral valve  regurgitation.   4. The aortic valve is tricuspid. Aortic valve regurgitation is not  visualized. No  aortic stenosis is present.   5. The inferior vena cava is normal in size with greater than 50%  respiratory  variability, suggesting right atrial pressure of 3 mmHg.   6. Agitated saline contrast bubble study was positive with shunting  observed after >6 cardiac cycles suggestive of intrapulmonary shunting    Consultations: Neurology cardiology  Discharge Exam: Vitals:   02/11/22 1015 02/11/22 1110  BP: 115/81 119/65  Pulse: 83 83  Resp: 16 18  Temp: 98.8 F (37.1 C) 98.5 F (36.9 C)  SpO2: 100% 100%   No complaints, son at bedside Discussed d/c plan  General: No acute distress. Lungs: unlabored Neurological: Alert and oriented 3. Moves all extremities 4. Cranial nerves II through XII grossly intact.  Discharge Instructions   Discharge Instructions     Ambulatory referral to Neurology   Complete by: As directed    An appointment is requested in approximately: 4 weeks with Dr. Billey Gosling for potential migraine. Thanks.   Call MD for:  difficulty breathing, headache or visual disturbances   Complete by: As directed    Call MD for:  extreme fatigue   Complete by: As directed    Call MD for:  hives   Complete by: As directed    Call MD for:  persistant dizziness or light-headedness   Complete by: As directed    Call MD for:  persistant nausea and vomiting   Complete by: As directed    Call MD for:  redness, tenderness, or signs of infection (pain, swelling, redness, odor or green/yellow discharge around incision site)   Complete by: As directed    Call MD for:  severe uncontrolled pain   Complete by: As directed    Call MD for:  temperature >100.4   Complete by: As directed    Diet - low sodium heart healthy   Complete by: As directed    Discharge instructions   Complete by: As directed    You were seen for Henson Fraticelli stroke.  This was likely related to the procedure you had Ryland Tungate few days before.  The symptoms you had could have been related to Girtie Wiersma migraine.  Continue aspirin daily.  Follow with neurology as an outpatient.  You have Burwell Bethel few pending test results at the time of discharge.   Follow these with your PCP or stroke doctor as an outpatient.  Your factor 5 leiden, prothrombin gene mutation, and serum homocysteine are pending.  You have Allyn Bartelson contrast allergy and should let providers know this if they're ordering imaging.  Return for new, recurrent, or worsening symptoms.  Please ask your PCP to request records from this hospitalization so they know what was done and what the next steps will be.   Increase activity slowly   Complete by: As directed       Allergies as of 02/11/2022       Reactions   Iodinated Contrast Media Hives, Itching        Medication List     TAKE these medications    acetaminophen 500 MG tablet Commonly known as: TYLENOL Take 1,000 mg by mouth every 6 (six) hours as needed for moderate pain.   aspirin EC 81 MG tablet Take 1 tablet (81 mg total) by mouth daily. Swallow whole.   Hair Skin & Nails Advanced Tabs Take 1 tablet by mouth daily.       Allergies  Allergen Reactions   Iodinated Contrast Media Hives and Itching    Follow-up Information  [facility] Follow up on 02/22/2022.   Why: your appointment is at 10 am. Please arrive early and bring Kelena Garrow picture ID, insurance card and current medications Contact information: [address], [place], [place] [address]  Phone: [phone]        Genia Harold, MD. Schedule an appointment as soon as possible for Birdella Sippel visit in 1 month(s).   Specialty: Neurology Contact information: [address], [place] [address] [phone]                  The results of significant diagnostics from this hospitalization (including imaging, microbiology, ancillary and laboratory) are listed below for reference.    Significant Diagnostic Studies: VAS [place] LOWER EXTREMITY VENOUS (DVT)  Result Date: 02/08/2022  Lower Venous DVT Study Patient Name:  BURMA KETCHER  Date of Exam:   02/08/2022 Medical Rec #: [id number]    Accession #:     [id number] Date of Birth: 1986/01/01    Patient Gender: F Patient Age:   27 years Exam Location:  [facility] Procedure:      VAS [place] LOWER EXTREMITY VENOUS (DVT) Referring Phys: Leni Pankonin POWELL JR --------------------------------------------------------------------------------  Indications: Stroke.  Comparison Study: No previous exam noted. Performing Technologist: Bobetta Lime BS, RVT  Examination Guidelines: Nainika Newlun complete evaluation includes B-mode imaging, spectral Doppler, color Doppler, and power Doppler as needed of all accessible portions of each vessel. Bilateral testing is considered an integral part of Donzell Coller complete examination. Limited examinations for reoccurring indications may be performed as noted. The reflux portion of the exam is performed with the patient in reverse Trendelenburg.  +---------+---------------+---------+-----------+----------+--------------+ RIGHT    CompressibilityPhasicitySpontaneityPropertiesThrombus Aging +---------+---------------+---------+-----------+----------+--------------+ CFV      Full           Yes      Yes                                 +---------+---------------+---------+-----------+----------+--------------+ SFJ      Full                                                        +---------+---------------+---------+-----------+----------+--------------+ FV Prox  Full                                                        +---------+---------------+---------+-----------+----------+--------------+ FV Mid   Full                                                        +---------+---------------+---------+-----------+----------+--------------+ FV DistalFull                                                        +---------+---------------+---------+-----------+----------+--------------+ PFV      Full                                                        +---------+---------------+---------+-----------+----------+--------------+  POP      Full           Yes      Yes                                 +---------+---------------+---------+-----------+----------+--------------+ PTV      Full                                                        +---------+---------------+---------+-----------+----------+--------------+ PERO     Full                                                        +---------+---------------+---------+-----------+----------+--------------+   +---------+---------------+---------+-----------+----------+--------------+ LEFT     CompressibilityPhasicitySpontaneityPropertiesThrombus Aging +---------+---------------+---------+-----------+----------+--------------+ CFV      Full           Yes      Yes                                 +---------+---------------+---------+-----------+----------+--------------+ SFJ      Full                                                        +---------+---------------+---------+-----------+----------+--------------+ FV Prox  Full                                                        +---------+---------------+---------+-----------+----------+--------------+ FV Mid   Full                                                        +---------+---------------+---------+-----------+----------+--------------+ FV DistalFull                                                        +---------+---------------+---------+-----------+----------+--------------+ PFV      Full                                                        +---------+---------------+---------+-----------+----------+--------------+ POP      Full           Yes      Yes                                 +---------+---------------+---------+-----------+----------+--------------+  PTV      Full                                                        +---------+---------------+---------+-----------+----------+--------------+ PERO     Full                                                         +---------+---------------+---------+-----------+----------+--------------+     Summary: BILATERAL: - No evidence of deep vein thrombosis seen in the lower extremities, bilaterally. -No evidence of popliteal cyst, bilaterally.   *See table(s) above for measurements and observations. Electronically signed by Monica Martinez MD on 02/08/2022 at 1:50:55 PM.    Final    ECHOCARDIOGRAM COMPLETE BUBBLE STUDY  Result Date: 02/08/2022    ECHOCARDIOGRAM REPORT   Patient Name:   OLINA MELFI Date of Exam: 02/08/2022 Medical Rec #:  [id number]   Height:       66.0 in Accession #:    [id number]  Weight:       206.0 lb Date of Birth:  10/31/1985   BSA:          2.025 m Patient Age:    58 years    BP:           107/39 mmHg Patient Gender: F           HR:           70 bpm. Exam Location:  Inpatient Procedure: 2D Echo, Color Doppler, Cardiac Doppler and Saline Contrast Bubble            Study Indications:    Stroke  History:        Patient has no prior history of Echocardiogram examinations.  Sonographer:    Danne Baxter RDCS, FE, PE Referring Phys: [phone] Lurleen Soltero CALDWELL POWELL JR IMPRESSIONS  1. Left ventricular ejection fraction, by estimation, is 60 to 65%. The left ventricle has normal function. The left ventricle has no regional wall motion abnormalities. Left ventricular diastolic parameters were normal.  2. Right ventricular systolic function is normal. The right ventricular size is normal. There is normal pulmonary artery systolic pressure.  3. The mitral valve is normal in structure. No evidence of mitral valve regurgitation.  4. The aortic valve is tricuspid. Aortic valve regurgitation is not visualized. No aortic stenosis is present.  5. The inferior vena cava is normal in size with greater than 50% respiratory variability, suggesting right atrial pressure of 3 mmHg.  6. Agitated saline contrast bubble study was positive with shunting observed after >6 cardiac cycles suggestive of  intrapulmonary shunting. FINDINGS  Left Ventricle: Left ventricular ejection fraction, by estimation, is 60 to 65%. The left ventricle has normal function. The left ventricle has no regional wall motion abnormalities. The left ventricular internal cavity size was normal in size. There is  no left ventricular hypertrophy. Left ventricular diastolic parameters were normal. Right Ventricle: The right ventricular size is normal. No increase in right ventricular wall thickness. Right ventricular systolic function is normal. There is normal pulmonary artery systolic pressure. The tricuspid regurgitant velocity is 1.64 m/s, and  with an assumed right atrial pressure of 3 mmHg, the  estimated right ventricular systolic pressure is 28.7 mmHg. Left Atrium: Left atrial size was normal in size. Right Atrium: Right atrial size was normal in size. Pericardium: There is no evidence of pericardial effusion. Mitral Valve: The mitral valve is normal in structure. No evidence of mitral valve regurgitation. Tricuspid Valve: The tricuspid valve is normal in structure. Tricuspid valve regurgitation is trivial. Aortic Valve: The aortic valve is tricuspid. Aortic valve regurgitation is not visualized. No aortic stenosis is present. Pulmonic Valve: The pulmonic valve was grossly normal. Pulmonic valve regurgitation is trivial. Aorta: The aortic root and ascending aorta are structurally normal, with no evidence of dilitation. Venous: The inferior vena cava is normal in size with greater than 50% respiratory variability, suggesting right atrial pressure of 3 mmHg. IAS/Shunts: The interatrial septum was not well visualized. Agitated saline contrast was given intravenously to evaluate for intracardiac shunting. Agitated saline contrast bubble study was positive with shunting observed after >6 cardiac cycles suggestive of intrapulmonary shunting.  LEFT VENTRICLE PLAX 2D LVIDd:         4.60 cm   Diastology LVIDs:         2.90 cm   LV e' medial:     11.30 cm/s LV PW:         0.90 cm   LV E/e' medial:  5.8 LV IVS:        0.80 cm   LV e' lateral:   13.50 cm/s LVOT diam:     1.90 cm   LV E/e' lateral: 4.8 LV SV:         52 LV SV Index:   26 LVOT Area:     2.84 cm  RIGHT VENTRICLE RV S prime:     12.80 cm/s TAPSE (M-mode): 2.0 cm LEFT ATRIUM             Index        RIGHT ATRIUM           Index LA diam:        2.70 cm 1.33 cm/m   RA Area:     11.40 cm LA Vol (A2C):   25.6 ml 12.64 ml/m  RA Volume:   24.80 ml  12.25 ml/m LA Vol (A4C):   29.9 ml 14.76 ml/m LA Biplane Vol: 27.7 ml 13.68 ml/m  AORTIC VALVE LVOT Vmax:   95.80 cm/s LVOT Vmean:  68.500 cm/s LVOT VTI:    0.183 m  AORTA Ao Root diam: 2.80 cm Ao Asc diam:  2.60 cm MITRAL VALVE               TRICUSPID VALVE MV Area (PHT): 2.74 cm    TR Peak grad:   10.8 mmHg MV Decel Time: 277 msec    TR Vmax:        164.00 cm/s MV E velocity: 65.00 cm/s MV Dontrelle Mazon velocity: 66.60 cm/s  SHUNTS MV E/Hikeem Andersson ratio:  0.98        Systemic VTI:  0.18 m                            Systemic Diam: 1.90 cm Oswaldo Milian MD Electronically signed by Oswaldo Milian MD Signature Date/Time: 02/08/2022/12:07:40 PM    Final    CT ANGIO HEAD NECK W WO CM (CODE STROKE)  Result Date: 02/07/2022 CLINICAL DATA:  Left-sided numbness. 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 EXAM: CT ANGIOGRAPHY HEAD AND NECK TECHNIQUE: Multidetector CT imaging of  the head and neck was performed using the standard protocol during bolus administration of intravenous contrast. Multiplanar CT image reconstructions and MIPs were obtained to evaluate the vascular anatomy. Carotid stenosis measurements (when applicable) are obtained utilizing NASCET criteria, using the distal internal carotid diameter as the denominator. RADIATION DOSE REDUCTION: This exam was performed according to the departmental dose-optimization program which includes automated exposure control, adjustment of the mA and/or kV according to patient size and/or  use of iterative reconstruction technique. CONTRAST:  31mL OMNIPAQUE IOHEXOL 350 MG/ML SOLN COMPARISON:  11/08/2021 FINDINGS: CTA NECK FINDINGS SKELETON: There is no bony spinal canal stenosis. No lytic or blastic lesion. OTHER NECK: Normal pharynx, larynx and major salivary glands. No cervical lymphadenopathy. Unremarkable thyroid gland. UPPER CHEST: No pneumothorax or pleural effusion. No nodules or masses. AORTIC ARCH: There is no calcific atherosclerosis of the aortic arch. There is no aneurysm, dissection or hemodynamically significant stenosis of the visualized portion of the aorta. Conventional 3 vessel aortic branching pattern. The visualized proximal subclavian arteries are widely patent. RIGHT CAROTID SYSTEM: Normal without aneurysm, dissection or stenosis. LEFT CAROTID SYSTEM: Normal without aneurysm, dissection or stenosis. VERTEBRAL ARTERIES: Left dominant configuration. Both origins are clearly patent. There is no dissection, occlusion or flow-limiting stenosis to the skull base (V1-V3 segments). CTA HEAD FINDINGS POSTERIOR CIRCULATION: --Vertebral arteries: Normal V4 segments. --Inferior cerebellar arteries: Normal. --Basilar artery: Normal. --Superior cerebellar arteries: Normal. --Posterior cerebral arteries (PCA): Normal. ANTERIOR CIRCULATION: --Intracranial internal carotid arteries: Normal. --Anterior cerebral arteries (ACA): Status post coiling of Diago Haik-comm aneurysm. There remains Jessice Madill 3 mm aneurysm at the left A1/Bricelyn Freestone-comm junction. There is also fusiform 2 mm dilatation of the Lewi Drost-comm. Both of these are unchanged. --Middle cerebral arteries (MCA): Normal. VENOUS SINUSES: As permitted by contrast timing, patent. ANATOMIC VARIANTS: None Review of the MIP images confirms the above findings. IMPRESSION: 1. No emergent large vessel occlusion or high-grade stenosis. 2. Status post coiling of dominant portion of Jonita Hirota-comm aneurysm. 3. Unchanged 3 mm aneurysm at the left A1/Mieke Brinley-comm junction and 2 mm fusiform Brunette Lavalle-comm  dilatation. Electronically Signed   By: Ulyses Jarred M.D.   On: 02/07/2022 23:18   MR BRAIN WO CONTRAST  Result Date: 02/07/2022 CLINICAL DATA:  Neuro deficit, acute, stroke suspected EXAM: MRI HEAD WITHOUT CONTRAST TECHNIQUE: Multiplanar, multiecho pulse sequences of the brain and surrounding structures were obtained without intravenous contrast. COMPARISON:  Same day CT head. FINDINGS: Brain: Multiple acute right cerebellar infarcts. Additional punctate acute infarcts in the left cerebellum and lateral left temporal lobe. Mild DWI hyperintensity in the left corona radiata is favored to represent T2 shine through given no ADC correlate and T2 hyperintensities in this region that probably correlate with prior white matter infarcts. No acute hemorrhage, mass lesion, midline shift, hydrocephalus, or extra-axial fluid collection. Vascular: Major arterial flow voids are maintained at the skull base. Skull and upper cervical spine: Normal marrow signal. Sinuses/Orbits: Clear sinuses.  No acute orbital findings. Other: No mastoid effusions. IMPRESSION: 1. Multiple acute right cerebellar infarcts. Additional punctate acute infarcts in the left cerebellum and lateral left temporal lobe. Mild associated edema without mass effect. Given involvement of multiple vascular territories, consider an embolic etiology. 2. Probable prior small white matter infarcts in the left corona radiata. Electronically Signed   By: Margaretha Sheffield M.D.   On: 02/07/2022 19:53   CT HEAD CODE STROKE WO CONTRAST  Result Date: 02/07/2022 CLINICAL DATA:  Code stroke.  Neuro deficit, acute, stroke suspected EXAM: CT HEAD WITHOUT CONTRAST TECHNIQUE: Contiguous axial images  were obtained from the base of the skull through the vertex without intravenous contrast. RADIATION DOSE REDUCTION: This exam was performed according to the departmental dose-optimization program which includes automated exposure control, adjustment of the mA and/or kV  according to patient size and/or use of iterative reconstruction technique. COMPARISON:  CT head November 09, 2021. FINDINGS: Brain: Probably acute right cerebellar infarct with hypoattenuation loss of gray differentiation. No significant mass effect. Vascular: Aneurysm coiling in the region of the anterior communicating artery. Streak limits surrounding area. No hyperdense vessel identified. Skull: No acute fracture. Sinuses/Orbits: Clear sinuses.  No acute orbital findings. Other: No mastoid effusions. IMPRESSION: 1. Probably acute right cerebellar infarct. No significant mass effect. Recommend MRI if the patient is able. 2. No acute hemorrhage. Findings discussed with Dr. Milas Gain via telephone at 5:18 PM. Electronically Signed   By: Margaretha Sheffield M.D.   On: 02/07/2022 17:21   IR ANGIO INTRA EXTRACRAN SEL INTERNAL CAROTID BILAT MOD SED  Result Date: 02/04/2022 INDICATION: 36 year old female admitted to the hospital on 11/08/2021 with acute subarachnoid hemorrhage. She underwent Porchia Sinkler diagnostic cerebral angiogram-showed anterior tiny communicating artery aneurysm. Coil embolization was performed in the same session coil. She made great recovery without any neurological deficit and returns today for Dashawn Golda follow-up diagnostic cerebral angiogram. EXAM: ULTRASOUND-GUIDED VASCULAR ACCESS DIAGNOSTIC CEREBRAL ANGIOGRAM 3D ROTATIONAL ANGIOGRAM COMPARISON:  Cerebral angiogram November 08, 2021. MEDICATIONS: 5,000 IU heparin, 5 mg Verapamil and 200 mcg nitroglycerin IA to right radial artery. 200 mcg nitroglycerin subcutaneous. ANESTHESIA/SEDATION: Moderate (conscious) sedation was employed during this procedure. Areej Tayler total of Versed 1 mg and Fentanyl 25 mcg was administered intravenously by the radiology nurse. Total intra-service moderate Sedation Time: 42 minutes. The patient's level of consciousness and vital signs were monitored continuously by radiology nursing throughout the procedure under my direct supervision. CONTRAST:  100 mL  of Omnipaque 300 milligram/mL. FLUOROSCOPY: Radiation Exposure Index (as provided by the fluoroscopic device): 580 mGy Kerma COMPLICATIONS: None immediate. TECHNIQUE: Informed written consent was obtained from the patient after Laelle Bridgett thorough discussion of the procedural risks, benefits and alternatives. All questions were addressed. Maximal Sterile Barrier Technique was utilized including caps, mask, sterile gowns, sterile gloves, sterile drape, hand hygiene and skin antiseptic. Tyshell Ramberg timeout was performed prior to the initiation of the procedure. Using the modified Seldinger technique and Kaylem Gidney micropuncture kit, access was gained to the distal right radial artery at the anatomical snuffbox and Amedee Cerrone 5 French sheath was placed. Real-time ultrasound guidance was utilized for vascular access including the acquisition of Nalayah Hitt permanent ultrasound image documenting patency of the accessed vessel. Slow intra arterial infusion of 5,000 IU heparin, 5 mg Verapamil and 200 mcg nitroglycerin diluted in patient's own blood was performed. No significant fluctuation in patient's blood pressure seen. Then, Chabely Norby right radial artery angiogram was obtained via sheath side port. Normal brachial artery branching pattern seen. No significant anatomical variation. The right radial artery caliber is adequate for vascular access. Next, Krishang Reading 5 [place] Simmons 2 glide catheter was navigated over Theador Jezewski 0.035" Terumo Glidewire into the right subclavian artery under fluoroscopic guidance. Under the fluoroscopy, the catheter was advanced into the aortic arch where the catheter tip was reformed. The catheter was then placed into the left common carotid artery. Frontal and lateral angiograms of the neck were obtained. Using biplane roadmap guidance, the catheter was placed into the left internal carotid artery. Frontal, lateral, magnified left anterior oblique and magnified lateral views of the head were obtained. Next, the catheter was placed into the  left subclavian  artery and then advanced into the left vertebral artery. Frontal and lateral angiograms of the head were then obtained. The catheter was then placed into the right common carotid artery. Frontal and lateral angiograms of the neck were obtained. Using biplane roadmap guidance, the catheter was placed into the right internal carotid artery. Frontal and lateral angiograms of the head were obtained. 3D rotational angiograms were acquired and post processed in Starr Engel separate workstation under concurrent attending physician supervision. Selected images were sent to PACS. Additionally, multiple magnified oblique views of the head were obtained centered on the anterior communicating artery and centered on the right internal carotid artery. Finally, the catheter was placed into the right subclavian artery. Using roadmap guidance, the catheter was advanced into the right vertebral artery. Frontal and lateral angiograms of the head were obtained. The The catheter was subsequently withdrawn. An inflatable band was placed and inflated over the right hand access site. The vascular sheath was withdrawn and the band was slowly deflated until brisk flow was noted through the arteriotomy site. At this point, the band was reinflated with additional 2 cc of air to obtain patent hemostasis. FINDINGS: Right radial artery ultrasound and right radial artery angiogram: The caliber of the distal right radial artery is appropriate for angiogram access. The right radial artery and the right ulnar artery have normal course and caliber. No significant anatomical variants noted. Left CCA angiograms: Cervical angiograms show normal course and caliber of the visualized left common carotid and internal carotid arteries. There are no significant stenoses. 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 Therefore, no contrast opacification of the left ACA and MCA vascular tree is seen. Luminal  caliber is smooth and tapering. No aneurysms or abnormally high-flow, early draining veins are seen. No regions of abnormal hypervascularity are noted. The visualized dural sinuses are patent. Left vertebral artery angiograms: The left vertebral artery, basilar artery, and bilateral posterior cerebral arteries shows normal contrast opacification. Hypoplastic left P1 and to Elvan Ebron lesser extent the right P1/PCA segment. The right vertebral artery is opacified by contrast reflux and is hypoplastic, particularly distal to the right PICA takeoff. Luminal caliber is smooth and tapering. No aneurysms or abnormally high-flow, early draining veins are seen. No regions of abnormal hypervascularity are noted. The visualized dural sinuses are patent. Right CCA angiograms: Cervical angiograms show normal course and caliber of the visualized right common carotid and internal carotid arteries. There are no significant stenoses. Right ICA angiograms: There is brisk vascular contrast filling of the right ACA and MCA vascular trees. Andrei Mccook prominent anterior communicating artery is noted supplying the left ACA and MCA vascular trees which show brisk contrast opacification. 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 Lind Ausley tiny 1-2 mm outpouching projecting posteriorly from the anterior communicating artery is only appreciated on post processing of the 3D rotational angiogram, may represent an additional tiny aneurysm versus an infundibulum. 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 No abnormally high-flow, early draining veins are seen. No regions of abnormal hypervascularity are noted. The visualized dural sinuses are patent. 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 PROCEDURE: No intervention performed. IMPRESSION:  1. 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 An additional tiny (1-2 mm) posteriorly projecting outpouching from the anterior communicating artery is only seen on post processed 3D angiogram images and may represent an additional aneurysm versus infundibulum. 2. 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 PLAN: Patient will return for consultation to discuss angiogram findings. Electronically Signed   By: Pedro Earls M.D.   On: 02/04/2022 15:38   IR US Guide Vasc Access Right  Result Date: 02/04/2022 INDICATION: 36 year old female admitted to the hospital on 11/08/2021 with acute subarachnoid hemorrhage. She underwent Marguerite Jarboe diagnostic cerebral angiogram-showed anterior tiny communicating artery aneurysm. Coil embolization was performed in the same session coil. She made great recovery without any neurological deficit and returns today for Tamala Manzer follow-up diagnostic cerebral angiogram. EXAM: ULTRASOUND-GUIDED VASCULAR ACCESS DIAGNOSTIC CEREBRAL ANGIOGRAM 3D ROTATIONAL ANGIOGRAM COMPARISON:  Cerebral angiogram November 08, 2021. MEDICATIONS: 5,000 IU heparin, 5 mg Verapamil and 200 mcg nitroglycerin IA to right radial artery. 200 mcg nitroglycerin subcutaneous. ANESTHESIA/SEDATION: Moderate (conscious) sedation was employed during this procedure. Jarvis Knodel total of Versed 1 mg and Fentanyl 25 mcg was administered intravenously by the radiology nurse. Total intra-service moderate Sedation Time: 42 minutes. The patient's level of consciousness and vital signs were monitored continuously by radiology nursing throughout the procedure under my direct supervision. CONTRAST:  100 mL of Omnipaque 300  milligram/mL. FLUOROSCOPY: Radiation Exposure Index (as provided by the fluoroscopic device): 161 mGy Kerma COMPLICATIONS: None immediate. TECHNIQUE: Informed written consent was obtained from the patient after Jaiyla Granados thorough discussion of the procedural risks, benefits and alternatives. All questions were addressed. Maximal Sterile Barrier Technique was utilized including caps, mask, sterile gowns, sterile gloves, sterile drape, hand hygiene and skin antiseptic. Traveon Louro timeout was performed prior to the initiation of the procedure. Using the modified Seldinger technique and Zakayla Martinec micropuncture kit, access was gained to the distal right radial artery at the anatomical snuffbox and Darielys Giglia 5 French sheath was placed. Real-time ultrasound guidance was utilized for vascular access including the acquisition of Pria Klosinski permanent ultrasound image documenting patency of the accessed vessel. Slow intra arterial infusion of 5,000 IU heparin, 5 mg Verapamil and 200 mcg nitroglycerin diluted in patient's own blood was performed. No significant fluctuation in patient's blood pressure seen. Then, Shawndrea Rutkowski right radial artery angiogram was obtained via sheath side port. Normal brachial artery branching pattern seen. No significant anatomical variation. The right radial artery caliber is adequate for vascular access. Next, Fabricio Endsley 5 [place] Simmons 2 glide catheter was navigated over Izaias Krupka 0.035" Terumo Glidewire into the right subclavian artery under fluoroscopic guidance. Under the fluoroscopy, the catheter was advanced into the aortic arch where the catheter tip was reformed. The catheter was then placed into the left common carotid artery. Frontal and lateral angiograms of the neck were obtained. Using biplane roadmap guidance, the catheter was placed into the left internal carotid artery. Frontal, lateral, magnified left anterior oblique and magnified lateral views of the head were obtained. Next, the catheter was placed into the left subclavian artery and then  advanced into the left vertebral artery. Frontal and lateral angiograms of the head were then obtained. The catheter was then placed into the right common carotid artery. Frontal and lateral angiograms of the neck were obtained. Using biplane roadmap guidance, the catheter was placed into the right internal carotid artery. Frontal and lateral angiograms of the head were obtained. 3D rotational angiograms were  acquired and post processed in Sherita Decoste separate workstation under concurrent attending physician supervision. Selected images were sent to PACS. Additionally, multiple magnified oblique views of the head were obtained centered on the anterior communicating artery and centered on the right internal carotid artery. Finally, the catheter was placed into the right subclavian artery. Using roadmap guidance, the catheter was advanced into the right vertebral artery. Frontal and lateral angiograms of the head were obtained. The The catheter was subsequently withdrawn. An inflatable band was placed and inflated over the right hand access site. The vascular sheath was withdrawn and the band was slowly deflated until brisk flow was noted through the arteriotomy site. At this point, the band was reinflated with additional 2 cc of air to obtain patent hemostasis. FINDINGS: Right radial artery ultrasound and right radial artery angiogram: The caliber of the distal right radial artery is appropriate for angiogram access. The right radial artery and the right ulnar artery have normal course and caliber. No significant anatomical variants noted. Left CCA angiograms: Cervical angiograms show normal course and caliber of the visualized left common carotid and internal carotid arteries. There are no significant stenoses. 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 Therefore, no contrast opacification of the left ACA and MCA vascular tree is seen. Luminal caliber is  smooth and tapering. No aneurysms or abnormally high-flow, early draining veins are seen. No regions of abnormal hypervascularity are noted. The visualized dural sinuses are patent. Left vertebral artery angiograms: The left vertebral artery, basilar artery, and bilateral posterior cerebral arteries shows normal contrast opacification. Hypoplastic left P1 and to Nahomi Hegner lesser extent the right P1/PCA segment. The right vertebral artery is opacified by contrast reflux and is hypoplastic, particularly distal to the right PICA takeoff. Luminal caliber is smooth and tapering. No aneurysms or abnormally high-flow, early draining veins are seen. No regions of abnormal hypervascularity are noted. The visualized dural sinuses are patent. Right CCA angiograms: Cervical angiograms show normal course and caliber of the visualized right common carotid and internal carotid arteries. There are no significant stenoses. Right ICA angiograms: There is brisk vascular contrast filling of the right ACA and MCA vascular trees. Demontay Grantham prominent anterior communicating artery is noted supplying the left ACA and MCA vascular trees which show brisk contrast opacification. 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 Matika Bartell tiny 1-2 mm outpouching projecting posteriorly from the anterior communicating artery is only appreciated on post processing of the 3D rotational angiogram, may represent an additional tiny aneurysm versus an infundibulum. 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 No abnormally high-flow, early draining veins are seen. No regions of abnormal hypervascularity are noted. The visualized dural sinuses are patent. Right vertebral artery angiograms: Hypoplastic right vertebral artery opacified mainly the right PICA with severe  hypoplasia distal to the PICA takeoff and minimal opacification of the basilar artery bilateral posterior cerebral arteries. PROCEDURE: No intervention performed. IMPRESSION: 1. 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 An additional tiny (1-2 mm) posteriorly projecting outpouching from the anterior communicating artery is only seen on post processed 3D angiogram images and may represent an additional aneurysm versus infundibulum. 2. 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 PLAN: Patient will return for consultation  to discuss angiogram findings. Electronically Signed   By: Pedro Earls M.D.   On: 02/04/2022 15:38   IR 3D Independent Darreld Mclean  Result Date: 02/04/2022 INDICATION: 36 year old female admitted to the hospital on 11/08/2021 with acute subarachnoid hemorrhage. She underwent Cedrick Partain diagnostic cerebral angiogram-showed anterior tiny communicating artery aneurysm. Coil embolization was performed in the same session coil. She made great recovery without any neurological deficit and returns today for Maree Ainley follow-up diagnostic cerebral angiogram. EXAM: ULTRASOUND-GUIDED VASCULAR ACCESS DIAGNOSTIC CEREBRAL ANGIOGRAM 3D ROTATIONAL ANGIOGRAM COMPARISON:  Cerebral angiogram November 08, 2021. MEDICATIONS: 5,000 IU heparin, 5 mg Verapamil and 200 mcg nitroglycerin IA to right radial artery. 200 mcg nitroglycerin subcutaneous. ANESTHESIA/SEDATION: Moderate (conscious) sedation was employed during this procedure. Amiliah Campisi total of Versed 1 mg and Fentanyl 25 mcg was administered intravenously by the radiology nurse. Total intra-service moderate Sedation Time: 42 minutes. The patient's level of consciousness and vital signs were monitored continuously by radiology nursing throughout the procedure under my direct supervision. CONTRAST:  100 mL of Omnipaque 300 milligram/mL.  FLUOROSCOPY: Radiation Exposure Index (as provided by the fluoroscopic device): 573 mGy Kerma COMPLICATIONS: None immediate. TECHNIQUE: Informed written consent was obtained from the patient after Amiee Wiley thorough discussion of the procedural risks, benefits and alternatives. All questions were addressed. Maximal Sterile Barrier Technique was utilized including caps, mask, sterile gowns, sterile gloves, sterile drape, hand hygiene and skin antiseptic. Zamiyah Resendes timeout was performed prior to the initiation of the procedure. Using the modified Seldinger technique and Celsa Nordahl micropuncture kit, access was gained to the distal right radial artery at the anatomical snuffbox and Shyrl Obi 5 French sheath was placed. Real-time ultrasound guidance was utilized for vascular access including the acquisition of Jasper Hanf permanent ultrasound image documenting patency of the accessed vessel. Slow intra arterial infusion of 5,000 IU heparin, 5 mg Verapamil and 200 mcg nitroglycerin diluted in patient's own blood was performed. No significant fluctuation in patient's blood pressure seen. Then, Ike Maragh right radial artery angiogram was obtained via sheath side port. Normal brachial artery branching pattern seen. No significant anatomical variation. The right radial artery caliber is adequate for vascular access. Next, Karla Pavone 5 [place] Simmons 2 glide catheter was navigated over Shunsuke Granzow 0.035" Terumo Glidewire into the right subclavian artery under fluoroscopic guidance. Under the fluoroscopy, the catheter was advanced into the aortic arch where the catheter tip was reformed. The catheter was then placed into the left common carotid artery. Frontal and lateral angiograms of the neck were obtained. Using biplane roadmap guidance, the catheter was placed into the left internal carotid artery. Frontal, lateral, magnified left anterior oblique and magnified lateral views of the head were obtained. Next, the catheter was placed into the left subclavian artery and then advanced into the  left vertebral artery. Frontal and lateral angiograms of the head were then obtained. The catheter was then placed into the right common carotid artery. Frontal and lateral angiograms of the neck were obtained. Using biplane roadmap guidance, the catheter was placed into the right internal carotid artery. Frontal and lateral angiograms of the head were obtained. 3D rotational angiograms were acquired and post processed in Ralphine Hinks separate workstation under concurrent attending physician supervision. Selected images were sent to PACS. Additionally, multiple magnified oblique views of the head were obtained centered on the anterior communicating artery and centered on the right internal carotid artery. Finally, the catheter was placed into the right subclavian artery. Using roadmap guidance, the catheter was advanced into the right vertebral artery. Frontal and lateral angiograms of the  head were obtained. The The catheter was subsequently withdrawn. An inflatable band was placed and inflated over the right hand access site. The vascular sheath was withdrawn and the band was slowly deflated until brisk flow was noted through the arteriotomy site. At this point, the band was reinflated with additional 2 cc of air to obtain patent hemostasis. FINDINGS: Right radial artery ultrasound and right radial artery angiogram: The caliber of the distal right radial artery is appropriate for angiogram access. The right radial artery and the right ulnar artery have normal course and caliber. No significant anatomical variants noted. Left CCA angiograms: Cervical angiograms show normal course and caliber of the visualized left common carotid and internal carotid arteries. There are no significant stenoses. 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 Therefore, no contrast opacification of the left ACA and MCA vascular tree is seen. Luminal caliber is smooth and tapering.  No aneurysms or abnormally high-flow, early draining veins are seen. No regions of abnormal hypervascularity are noted. The visualized dural sinuses are patent. Left vertebral artery angiograms: The left vertebral artery, basilar artery, and bilateral posterior cerebral arteries shows normal contrast opacification. Hypoplastic left P1 and to Rayland Hamed lesser extent the right P1/PCA segment. The right vertebral artery is opacified by contrast reflux and is hypoplastic, particularly distal to the right PICA takeoff. Luminal caliber is smooth and tapering. No aneurysms or abnormally high-flow, early draining veins are seen. No regions of abnormal hypervascularity are noted. The visualized dural sinuses are patent. Right CCA angiograms: Cervical angiograms show normal course and caliber of the visualized right common carotid and internal carotid arteries. There are no significant stenoses. Right ICA angiograms: There is brisk vascular contrast filling of the right ACA and MCA vascular trees. Twan Harkin prominent anterior communicating artery is noted supplying the left ACA and MCA vascular trees which show brisk contrast opacification. 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 Salbador Fiveash tiny 1-2 mm outpouching projecting posteriorly from the anterior communicating artery is only appreciated on post processing of the 3D rotational angiogram, may represent an additional tiny aneurysm versus an infundibulum. 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 No abnormally high-flow, early draining veins are seen. No regions of abnormal hypervascularity are noted. The visualized dural sinuses are patent. 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 PROCEDURE: No intervention performed. IMPRESSION: 1. 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 An additional tiny (1-2 mm) posteriorly projecting outpouching from the anterior communicating artery is only seen on post processed 3D angiogram images and may represent an additional aneurysm versus infundibulum. 2. 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 PLAN: Patient will return for consultation to discuss angiogram findings. Electronically Signed   By: Pedro Earls M.D.   On: 02/04/2022 15:38   IR ANGIO VERTEBRAL SEL VERTEBRAL BILAT MOD SED  Result Date: 02/04/2022 INDICATION: 37 year old female admitted to the hospital on 11/08/2021 with acute subarachnoid hemorrhage. She underwent Kaileigh Viswanathan diagnostic cerebral angiogram-showed anterior tiny communicating artery aneurysm. Coil embolization was performed in the same session coil. She made great recovery  without any neurological deficit and returns today for Omer Puccinelli follow-up diagnostic cerebral angiogram. EXAM: ULTRASOUND-GUIDED VASCULAR ACCESS DIAGNOSTIC CEREBRAL ANGIOGRAM 3D ROTATIONAL ANGIOGRAM COMPARISON:  Cerebral angiogram November 08, 2021. MEDICATIONS: 5,000 IU heparin, 5 mg Verapamil and 200 mcg nitroglycerin IA to right radial artery. 200 mcg nitroglycerin subcutaneous. ANESTHESIA/SEDATION: Moderate (conscious) sedation was employed during this procedure. Sherina Stammer total of Versed 1 mg and Fentanyl 25 mcg was administered intravenously by the radiology nurse. Total intra-service moderate Sedation Time: 42 minutes. The patient's level of consciousness and vital signs were monitored continuously by radiology nursing throughout the procedure under my direct supervision. CONTRAST:  100 mL of Omnipaque 300 milligram/mL.  FLUOROSCOPY: Radiation Exposure Index (as provided by the fluoroscopic device): 400 mGy Kerma COMPLICATIONS: None immediate. TECHNIQUE: Informed written consent was obtained from the patient after Marce Schartz thorough discussion of the procedural risks, benefits and alternatives. All questions were addressed. Maximal Sterile Barrier Technique was utilized including caps, mask, sterile gowns, sterile gloves, sterile drape, hand hygiene and skin antiseptic. Shalaina Guardiola timeout was performed prior to the initiation of the procedure. Using the modified Seldinger technique and Sua Spadafora micropuncture kit, access was gained to the distal right radial artery at the anatomical snuffbox and Etosha Wetherell 5 French sheath was placed. Real-time ultrasound guidance was utilized for vascular access including the acquisition of Lagena Strand permanent ultrasound image documenting patency of the accessed vessel. Slow intra arterial infusion of 5,000 IU heparin, 5 mg Verapamil and 200 mcg nitroglycerin diluted in patient's own blood was performed. No significant fluctuation in patient's blood pressure seen. Then, Dell Hurtubise right radial artery angiogram was obtained via sheath side port. Normal brachial artery branching pattern seen. No significant anatomical variation. The right radial artery caliber is adequate for vascular access. Next, Renaye Janicki 5 [place] Simmons 2 glide catheter was navigated over Shawnia Vizcarrondo 0.035" Terumo Glidewire into the right subclavian artery under fluoroscopic guidance. Under the fluoroscopy, the catheter was advanced into the aortic arch where the catheter tip was reformed. The catheter was then placed into the left common carotid artery. Frontal and lateral angiograms of the neck were obtained. Using biplane roadmap guidance, the catheter was placed into the left internal carotid artery. Frontal, lateral, magnified left anterior oblique and magnified lateral views of the head were obtained. Next, the catheter was placed into the left subclavian artery and then advanced into the  left vertebral artery. Frontal and lateral angiograms of the head were then obtained. The catheter was then placed into the right common carotid artery. Frontal and lateral angiograms of the neck were obtained. Using biplane roadmap guidance, the catheter was placed into the right internal carotid artery. Frontal and lateral angiograms of the head were obtained. 3D rotational angiograms were acquired and post processed in Rosalie Gelpi separate workstation under concurrent attending physician supervision. Selected images were sent to PACS. Additionally, multiple magnified oblique views of the head were obtained centered on the anterior communicating artery and centered on the right internal carotid artery. Finally, the catheter was placed into the right subclavian artery. Using roadmap guidance, the catheter was advanced into the right vertebral artery. Frontal and lateral angiograms of the head were obtained. The The catheter was subsequently withdrawn. An inflatable band was placed and inflated over the right hand access site. The vascular sheath was withdrawn and the band was slowly deflated until brisk flow was noted through the arteriotomy site. At this point, the band was reinflated with additional 2 cc of air to obtain patent hemostasis. FINDINGS: Right radial artery ultrasound and right radial  artery angiogram: The caliber of the distal right radial artery is appropriate for angiogram access. The right radial artery and the right ulnar artery have normal course and caliber. No significant anatomical variants noted. Left CCA angiograms: Cervical angiograms show normal course and caliber of the visualized left common carotid and internal carotid arteries. There are no significant stenoses. 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 Therefore, no contrast opacification of the left ACA and MCA vascular tree is seen. Luminal caliber is smooth and tapering.  No aneurysms or abnormally high-flow, early draining veins are seen. No regions of abnormal hypervascularity are noted. The visualized dural sinuses are patent. Left vertebral artery angiograms: The left vertebral artery, basilar artery, and bilateral posterior cerebral arteries shows normal contrast opacification. Hypoplastic left P1 and to Alexandera Kuntzman lesser extent the right P1/PCA segment. The right vertebral artery is opacified by contrast reflux and is hypoplastic, particularly distal to the right PICA takeoff. Luminal caliber is smooth and tapering. No aneurysms or abnormally high-flow, early draining veins are seen. No regions of abnormal hypervascularity are noted. The visualized dural sinuses are patent. Right CCA angiograms: Cervical angiograms show normal course and caliber of the visualized right common carotid and internal carotid arteries. There are no significant stenoses. Right ICA angiograms: There is brisk vascular contrast filling of the right ACA and MCA vascular trees. Palmer Fahrner prominent anterior communicating artery is noted supplying the left ACA and MCA vascular trees which show brisk contrast opacification. 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 Tage Feggins tiny 1-2 mm outpouching projecting posteriorly from the anterior communicating artery is only appreciated on post processing of the 3D rotational angiogram, may represent an additional tiny aneurysm versus an infundibulum. 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 No abnormally high-flow, early draining veins are seen. No regions of abnormal hypervascularity are noted. The visualized dural sinuses are patent. 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 PROCEDURE: No intervention performed. IMPRESSION: 1. 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 An additional tiny (1-2 mm) posteriorly projecting outpouching from the anterior communicating artery is only seen on post processed 3D angiogram images and may represent an additional aneurysm versus infundibulum. 2. 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 PLAN: Patient will return for consultation to discuss angiogram findings. Electronically Signed   By: Pedro Earls M.D.   On: 02/04/2022 15:38    Microbiology: No results found for this or any previous visit (from the past 240 hour(s)).   Labs: Basic Metabolic Panel: Recent Labs  Lab 02/07/22 1704 02/07/22 1710 02/08/22 0459 02/09/22 0201 02/11/22 0139  NA 138 138 140 138 138  K 4.0 3.9 4.3 3.7 4.1  CL 105 104 107 106 110  CO2 24  --  _0 GLUCOSE 96 90 102* 108* 92  BUN _1 CREATININE 0.76 0.70 0.87 0.90 0.70  CALCIUM 8.8*  --  9.0 8.5* 8.7*  MG  --   --   --  2.0  --   PHOS  --   --   --  4.7*  --    Liver Function Tests: Recent Labs  Lab 02/07/22 1704 02/09/22 0201  AST 24 17  ALT 23 18  ALKPHOS 79 71  BILITOT 0.4 0.4  PROT 6.6 5.2*  ALBUMIN 3.4* 2.8*   No results for input(s): "LIPASE", "AMYLASE" in the last 168 hours. No results for input(s): "AMMONIA" in the last 168 hours. CBC: Recent Labs  Lab 02/07/22 1704 02/07/22 1710 02/08/22 0459 02/09/22 0201  WBC 8.7  --  10.0 7.1  NEUTROABS 6.6  --   --  4.0  HGB 13.2 13.9 13.8 12.3  HCT 42.0 41.0 43.1 37.1  MCV 95.5  --  95.1 91.6  PLT 259  --  271 251   Cardiac Enzymes: No results for input(s): "CKTOTAL", "CKMB", "CKMBINDEX", "TROPONINI" in the last 168 hours. BNP: BNP (last 3 results) No  results for input(s): "BNP" in the last 8760 hours.  ProBNP (last 3 results) No results for input(s): "PROBNP" in the last 8760 hours.  CBG: Recent Labs  Lab 02/07/22 1703  GLUCAP 91       Signed:  Fayrene Helper MD.  [facility] 02/11/2022, 6:10 PM

## 2022-02-11 NOTE — Anesthesia Preprocedure Evaluation (Addendum)
Anesthesia Evaluation  Patient identified by MRN, date of birth, ID band Patient awake    Reviewed: Allergy & Precautions, NPO status , Patient's Chart, lab work & pertinent test results  History of Anesthesia Complications Negative for: history of anesthetic complications  Airway Mallampati: I  TM Distance: >3 FB Neck ROM: Full    Dental  (+) Teeth Intact, Dental Advisory Given   Pulmonary Patient did not abstain from smoking.,    breath sounds clear to auscultation       Cardiovascular negative cardio ROS   Rhythm:Regular  ECHO 7/10 1. Left ventricular ejection fraction, by estimation, is 60 to 65%. The  left ventricle has normal function. The left ventricle has no regional  wall motion abnormalities. Left ventricular diastolic parameters were  normal.  2. Right ventricular systolic function is normal. The right ventricular  size is normal. There is normal pulmonary artery systolic pressure.  3. The mitral valve is normal in structure. No evidence of mitral valve  regurgitation.  4. The aortic valve is tricuspid. Aortic valve regurgitation is not  visualized. No aortic stenosis is present.  5. The inferior vena cava is normal in size with greater than 50%  respiratory variability, suggesting right atrial pressure of 3 mmHg.  6. Agitated saline contrast bubble study was positive with shunting  observed after >6 cardiac cycles suggestive of intrapulmonary shunting.    Neuro/Psych  Headaches, SAH CVA negative psych ROS   GI/Hepatic negative GI ROS, Neg liver ROS,   Endo/Other  Morbid obesity  Renal/GU negative Renal ROS     Musculoskeletal negative musculoskeletal ROS (+)   Abdominal   Peds  Hematology negative hematology ROS (+) Lab Results      Component                Value               Date                      WBC                      15.7 (H)            11/08/2021                HGB                       13.6                11/08/2021                HCT                      41.5                11/08/2021                MCV                      93.9                11/08/2021                PLT                      245  11/08/2021              Anesthesia Other Findings Aneurysm   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   Reproductive/Obstetrics Lab Results      Component                Value               Date                      HCG                      <5.0                11/08/2021                                       Anesthesia Physical  Anesthesia Plan  ASA: 3  Anesthesia Plan: MAC   Post-op Pain Management: Minimal or no pain anticipated   Induction: Intravenous  PONV Risk Score and Plan: 3 and Propofol infusion  Airway Management Planned: Natural Airway, Nasal Cannula, Simple Face Mask and Mask  Additional Equipment: Arterial line  Intra-op Plan:   Post-operative Plan:   Informed Consent: I have reviewed the patients History and Physical, chart, labs and discussed the procedure including the risks, benefits and alternatives for the proposed anesthesia with the patient or authorized representative who has indicated his/her understanding and acceptance.     Dental advisory given  Plan Discussed with: CRNA and Anesthesiologist  Anesthesia Plan Comments: (HPI: Debbie Armstrong is a 36 y.o. 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  She was in hospital until 11/25/2021 at which time she was discharged to home with full recovery, no residual deficits.  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  Patient states she was feeling well until earlier today (02/07/2022) when she noticed new numbness most prominently in her left lower arm and then involving her left lower face and left thigh.  She did not have any associated weakness.  She did feels like she was having palpitations.  She denied any headache, change in vision, chest pain, dyspnea, nausea, vomiting, dizziness, gait instability. She is not currently taking any medications.)        Anesthesia Quick Evaluation

## 2022-02-11 NOTE — Anesthesia Procedure Notes (Signed)
Procedure Name: MAC Date/Time: 02/11/2022 8:41 AM  Performed by: Lieutenant Diego, CRNAPre-anesthesia Checklist: Patient identified, Emergency Drugs available, Suction available, Patient being monitored and Timeout performed Patient Re-evaluated:Patient Re-evaluated prior to induction Oxygen Delivery Method: Nasal cannula Preoxygenation: Pre-oxygenation with 100% oxygen Induction Type: IV induction

## 2022-02-11 NOTE — Plan of Care (Signed)
  Problem: Education: Goal: Knowledge of General Education information will improve Description: Including pain rating scale, medication(s)/side effects and non-pharmacologic comfort measures Outcome: Adequate for Discharge   Problem: Health Behavior/Discharge Planning: Goal: Ability to manage health-related needs will improve Outcome: Adequate for Discharge   Problem: Clinical Measurements: Goal: Ability to maintain clinical measurements within normal limits will improve Outcome: Adequate for Discharge Goal: Will remain free from infection Outcome: Adequate for Discharge Goal: Diagnostic test results will improve Outcome: Adequate for Discharge Goal: Cardiovascular complication will be avoided Outcome: Adequate for Discharge   Problem: Activity: Goal: Risk for activity intolerance will decrease Outcome: Adequate for Discharge   Problem: Nutrition: Goal: Adequate nutrition will be maintained Outcome: Adequate for Discharge   Problem: Coping: Goal: Level of anxiety will decrease Outcome: Adequate for Discharge   Problem: Elimination: Goal: Will not experience complications related to bowel motility Outcome: Adequate for Discharge Goal: Will not experience complications related to urinary retention Outcome: Adequate for Discharge   Problem: Pain Managment: Goal: General experience of comfort will improve Outcome: Adequate for Discharge   Problem: Safety: Goal: Ability to remain free from injury will improve Outcome: Adequate for Discharge   Problem: Skin Integrity: Goal: Risk for impaired skin integrity will decrease Outcome: Adequate for Discharge   Problem: Education: Goal: Knowledge of disease or condition will improve Outcome: Adequate for Discharge Goal: Knowledge of secondary prevention will improve (SELECT ALL) Outcome: Adequate for Discharge Goal: Knowledge of patient specific risk factors will improve (INDIVIDUALIZE FOR PATIENT) Outcome: Adequate for  Discharge   Problem: Coping: Goal: Will verbalize positive feelings about self Outcome: Adequate for Discharge Goal: Will identify appropriate support needs Outcome: Adequate for Discharge   Problem: Self-Care: Goal: Ability to participate in self-care as condition permits will improve Outcome: Adequate for Discharge Goal: Verbalization of feelings and concerns over difficulty with self-care will improve Outcome: Adequate for Discharge Goal: Ability to communicate needs accurately will improve Outcome: Adequate for Discharge   Problem: Ischemic Stroke/TIA Tissue Perfusion: Goal: Complications of ischemic stroke/TIA will be minimized Outcome: Adequate for Discharge

## 2022-02-11 NOTE — Consult Note (Addendum)
   [facility] Inpatient Consult   02/11/2022  Debbie Armstrong 07-Nov-1985 [id number]  Managed Medicaid [MM]:  [place] [employer] [correct] addendum  Assign for post hospital follow up with MM team, new PCP, new dx.  Charlesetta Shanks, RN BSN CCM [facility]  [phone] business mobile phone Toll free office [phone]  Fax number: [phone] [email or web address]

## 2022-02-11 NOTE — Interval H&P Note (Signed)
History and Physical Interval Note:  02/11/2022 8:23 AM  Debbie Armstrong  has presented today for surgery, with the diagnosis of STROKE.  The various methods of treatment have been discussed with the patient and family. After consideration of risks, benefits and other options for treatment, the patient has consented to  Procedure(s): TRANSESOPHAGEAL ECHOCARDIOGRAM (TEE) (N/A) as a surgical intervention.  The patient's history has been reviewed, patient examined, no change in status, stable for surgery.  I have reviewed the patient's chart and labs.  Questions were answered to the patient's satisfaction.     Bronte Sabado Cristal Deer

## 2022-02-12 ENCOUNTER — Ambulatory Visit: Payer: BC Managed Care – PPO | Admitting: Internal Medicine

## 2022-02-12 LAB — PROTHROMBIN GENE MUTATION

## 2022-02-15 ENCOUNTER — Telehealth: Payer: Self-pay

## 2022-02-15 LAB — FACTOR 5 LEIDEN

## 2022-02-15 NOTE — Telephone Encounter (Signed)
Transition Care Management Unsuccessful Follow-up Telephone Call  Date of discharge and from where:  02/11/2022, [facility]   Attempts:  1st Attempt  Reason for unsuccessful TCM follow-up call:  Left voice message on # [phone], call back requested.  Dr Laural Benes is listed as PCP but patient has appointment with [facility] on 02/22/2022.

## 2022-02-16 ENCOUNTER — Telehealth: Payer: Self-pay

## 2022-02-16 NOTE — Telephone Encounter (Signed)
Transition Care Management Unsuccessful Follow-up Telephone Call  Date of discharge and from where:  02/11/2022, [facility]     Attempts:  2nd Attempt  Reason for unsuccessful TCM follow-up call:  Left voice message on # [phone], call back requested.   Dr Laural Benes is listed as PCP but patient has appointment with [facility] on 02/22/2022.

## 2022-02-17 ENCOUNTER — Telehealth: Payer: Self-pay

## 2022-02-17 NOTE — Telephone Encounter (Signed)
Transition Care Management Follow-up Telephone Call Date of discharge and from where: 02/11/2022, [facility] How have you been since you were released from the hospital? She said she is feeling pretty good.  Any questions or concerns? No She has an appointment scheduled at [facility] on 02/22/2022.  She said she is not sure why that was scheduled, she is not planning to go there and wants to follow up with Dr Laural Benes   Items Reviewed: Did the pt receive and understand the discharge instructions provided? Yes  Medications obtained and verified? Yes - she only takes aspirin.  Other? No  Any new allergies since your discharge? No  Dietary orders reviewed? Yes Do you have support at home? Yes   Home Care and Equipment/Supplies: Were home health services ordered? no If so, what is the name of the agency? N/a  Has the agency set up a time to come to the patient's home? not applicable Were any new equipment or medical supplies ordered?  No What is the name of the medical supply agency? N/a Were you able to get the supplies/equipment? not applicable Do you have any questions related to the use of the equipment or supplies? No  Functional Questionnaire: (I = Independent and D = Dependent) ADLs: independent   Follow up appointments reviewed:  PCP Hospital f/u appt confirmed? Yes  Scheduled to see Dr Laural Benes - 03/09/2022.   Specialist Hospital f/u appt confirmed?  None scheduled at this time.    Are transportation arrangements needed? No  If their condition worsens, is the pt aware to call PCP or go to the Emergency Dept.? Yes Was the patient provided with contact information for the PCP's office or ED? Yes Was to pt encouraged to call back with questions or concerns? Yes

## 2022-02-18 ENCOUNTER — Telehealth (HOSPITAL_COMMUNITY): Payer: Self-pay

## 2022-02-18 NOTE — Telephone Encounter (Signed)
Called to schedule consult to discuss angio with Dr. Quay Burow, no answer, left vm. AW

## 2022-03-04 ENCOUNTER — Other Ambulatory Visit: Payer: Self-pay | Admitting: Obstetrics and Gynecology

## 2022-03-04 NOTE — Patient Instructions (Signed)
Hi Ms. Caicedo, I am sorry I missed you todayas a part of your Medicaid benefit, you are eligible for care management and care coordination services at no cost or copay. I was unable to reach you by phone today but would be happy to help you with your health related needs. Please feel free to call me at [phone].  A member of the Managed Medicaid care management team will reach out to you again over the next 30 business  days.   Kathi Der RN, BSN [place]  [facility] [employer] - Managed Medicaid High Risk [phone]

## 2022-03-04 NOTE — Patient Outreach (Signed)
Care Coordination  03/04/2022  Debbie Armstrong 1985-10-13 [id number]   Medicaid Managed Care   Unsuccessful Outreach Note  03/04/2022 Name: Debbie Armstrong MRN: [id number] DOB: 1986/02/01  Referred by: Marcine Matar, MD Reason for referral : High Risk Managed Medicaid (Unsuccessful telephone outreach)   An unsuccessful telephone outreach was attempted today. The patient was referred to the case management team for assistance with care management and care coordination.   Follow Up Plan: The care management team will reach out to the patient again over the next 30 business  days.   Kathi Der RN, BSN [place]  [facility] [employer] - Managed Medicaid High Risk [phone]

## 2022-03-09 ENCOUNTER — Other Ambulatory Visit (HOSPITAL_COMMUNITY)
Admission: RE | Admit: 2022-03-09 | Discharge: 2022-03-09 | Disposition: A | Discharge status: Home / Self Care | Payer: BC Managed Care – PPO | Source: Ambulatory Visit | Attending: Internal Medicine | Admitting: Internal Medicine

## 2022-03-09 ENCOUNTER — Encounter: Payer: Self-pay | Admitting: Internal Medicine

## 2022-03-09 ENCOUNTER — Ambulatory Visit: Payer: BC Managed Care – PPO | Attending: Internal Medicine | Admitting: Internal Medicine

## 2022-03-09 VITALS — BP 113/75 | HR 73 | Temp 98.7°F | Ht 66.0 in | Wt 213.8 lb

## 2022-03-09 DIAGNOSIS — Z124 Encounter for screening for malignant neoplasm of cervix: Secondary | ICD-10-CM

## 2022-03-09 DIAGNOSIS — N760 Acute vaginitis: Secondary | ICD-10-CM | POA: Insufficient documentation

## 2022-03-09 DIAGNOSIS — Z09 Encounter for follow-up examination after completed treatment for conditions other than malignant neoplasm: Secondary | ICD-10-CM

## 2022-03-09 DIAGNOSIS — Z8673 Personal history of transient ischemic attack (TIA), and cerebral infarction without residual deficits: Secondary | ICD-10-CM

## 2022-03-09 NOTE — Progress Notes (Signed)
Patient ID: Debbie Armstrong, female    DOB: [birth year]  MRN: [id number]  CC: Gynecologic Exam   Subjective: Debbie Armstrong is a 36 y.o. 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  Found to have right cerebellar infarct on imaging.  Findings of other studies are as shown below pulled from her discharge summary: MRI brain with multiple acute R cerebellar infarcts, additional punctate acute infarcts in L cerebellum and lateral L temporal lobe, mild edema without mass effect.  Probable prior small white matter infarcts in L corona radiata.  CTA head/neck without emergent LVO or high grade stenosis, s/p coiling of dominant portion of a comm aneurysm, unchanged 3 mm aneurysm at the L A1/A-comm junction and 2 mm fusiform a-comm dilatation Echo with positive bubble study (suggestive of intrapulmonary shunting), normal EF 60-65%, RVSF normal  Negative LE [place] TEE without cardiac source of embolism, no PFO/ASD by color and bubble study late positive.  No intraatrial shunting, may have intrapulmonary shunting. Pending hypercoagulable labs a1c 4.9, LDL 92 Aspirin alone.  No need for statin per neuro given periprocedural stroke.  Today: Was feeling a little drain a few weeks ago but now feels she is back to her baseline.  She has no residual weakness. Taking ASA daily as prescribed. I looked up the hypercoagulable studies that were not resulted at the time when she was discharged from the hospital.  These were all negative including lupus anticoagulant, screen for protein C and protein S deficiency, Antithrombin III, factor V Leyden, cardiolipin antibodies and prothrombin gene mutation. Has f/u with Dr. Baird Lyons 04/15/2022 and has appt with neurology early next mth.   I inquired how she is  doing mentally.  Patient states she is doing okay.  She has good family support from her parents and siblings and her children.   GYN History:  Pt is G5P2 (2 MC and 1 abortion) Any hx of abn paps?: no Menses regular or irregular?:  regular How long does menses last?  5 days.  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     aspirin EC 81 MG tablet Take 1 tablet (81 mg total) by mouth daily. Swallow whole. 30 tablet 12   Multiple Vitamins-Minerals (HAIR SKIN & NAILS ADVANCED) TABS Take 1 tablet by mouth daily.     No current facility-administered medications on file prior to visit.    Allergies  Allergen Reactions   Iodinated Contrast Media Hives and Itching  Social History   Socioeconomic History   Marital status: Single    Spouse name: Not on file   Number of children: 2   Years of education: Not on file   Highest education level: Some college, no degree  Occupational History   Occupation: Packing and shipping  Tobacco Use   Smoking status: Never   Smokeless tobacco: Never  Vaping Use   Vaping Use: Former  Substance and Sexual Activity   Alcohol use: Never   Drug use: Never    Sexual activity: Yes  Other Topics Concern   Not on file  Social History Narrative   Not on file   Social Determinants of Health   Financial Resource Strain: Not on file  Food Insecurity: Not on file  Transportation Needs: Not on file  Physical Activity: Not on file  Stress: Not on file  Social Connections: Not on file  Intimate Partner Violence: Not on file    Family History  Problem Relation Age of Onset   Hypertension Mother    Diabetes Son     Past Surgical History:  Procedure Laterality Date   BUBBLE STUDY  02/11/2022   Procedure: BUBBLE STUDY;  Surgeon: Buford Dresser, MD;  Location: [facility];  Service: Cardiovascular;;   IR 3D INDEPENDENT WKST  11/08/2021   IR 3D INDEPENDENT WKST  02/04/2022   IR ANGIO INTRA EXTRACRAN SEL INTERNAL CAROTID BILAT MOD SED  11/08/2021   IR ANGIO INTRA EXTRACRAN SEL INTERNAL CAROTID BILAT MOD SED  02/04/2022   IR ANGIO VERTEBRAL SEL VERTEBRAL BILAT MOD SED  02/04/2022   IR ANGIO VERTEBRAL SEL VERTEBRAL UNI L MOD SED  11/08/2021   IR ANGIOGRAM FOLLOW UP STUDY  11/08/2021   IR ANGIOGRAM FOLLOW UP STUDY  11/08/2021   IR ANGIOGRAM FOLLOW UP STUDY  11/08/2021   IR NEURO EACH ADD'L AFTER BASIC UNI RIGHT (MS)  11/08/2021   IR TRANSCATH/EMBOLIZ  11/08/2021   IR US GUIDE VASC ACCESS RIGHT  11/08/2021   IR US GUIDE VASC ACCESS RIGHT  02/04/2022   RADIOLOGY WITH ANESTHESIA N/A 11/08/2021   Procedure: INTERVENTIONAL RADIOLOGY WITH ANESTHESIA;  Surgeon: Radiologist, Medication, MD;  Location: [facility];  Service: Radiology;  Laterality: N/A;   TEE WITHOUT CARDIOVERSION N/A 02/11/2022   Procedure: TRANSESOPHAGEAL ECHOCARDIOGRAM (TEE);  Surgeon: Buford Dresser, MD;  Location: [facility] ENDOSCOPY;  Service: Cardiovascular;  Laterality: N/A;    ROS: Review of Systems Negative except as stated above  PHYSICAL EXAM: BP 113/75   Pulse 73   Temp 98.7 F (37.1 C) (Oral)   Ht 5\' 6"  (1.676 m)   Wt 213 lb 12.8 oz (97 kg)   LMP 02/22/2022   SpO2 97%   BMI 34.51 kg/m    Physical Exam  General appearance - alert, well appearing, and in no distress Mental status - normal mood, behavior, speech, dress, motor activity, and thought processes Breasts - CMA DeEtta Riki Sheer present for breast and pap:  breasts appear normal except inverted RT nipple which pt states has always been like this, no suspicious masses, no skin or nipple changes or axillary nodes Pelvic - normal external genitalia, vulva, vagina, cervix, uterus and adnexa Neuro:  CN grossly intact.   Power 5/5 in all 4s. Gait stable.     Latest Ref Rng & Units 02/11/2022    1:39 AM 02/09/2022    2:01 AM 02/08/2022    4:59 AM  CMP  Glucose 70 - 99 mg/dL 92  108  102   BUN 6 -  20 mg/dL 13  12  12    Creatinine 0.44 - 1.00 mg/dL  4.49  6.75   Sodium 135 - 145 mmol/L 138  138  140   Potassium 3.5 - 5.1 mmol/L 4.1  3.7  4.3   Chloride 98 - 111 mmol/L 110  106  107   CO2 22 - 32 mmol/L 25  24  24    Calcium 8.9 - 10.3 mg/dL 8.7  8.5  9.0   Total Protein 6.5 - 8.1 g/dL  5.2    Total Bilirubin 0.3 - 1.2 mg/dL  0.4    Alkaline Phos 38 - 126 U/L  71    AST 15 - 41 U/L  17    ALT 0 - 44 U/L  18     Lipid Panel     Component Value Date/Time   CHOL 151 02/08/2022 0459   TRIG 37 02/08/2022 0459   HDL 52 02/08/2022 0459   CHOLHDL 2.9 02/08/2022 0459   VLDL 7 02/08/2022 0459   LDLCALC 92 02/08/2022 0459    CBC    Component Value Date/Time   WBC 7.1 02/09/2022 0201   RBC 4.05 02/09/2022 0201   HGB 12.3 02/09/2022 0201   HCT 37.1 02/09/2022 0201   PLT 251 02/09/2022 0201   MCV 91.6 02/09/2022 0201   MCH 30.4 02/09/2022 0201   MCHC 33.2 02/09/2022 0201   RDW 12.5 02/09/2022 0201   LYMPHSABS 2.3 02/09/2022 0201   MONOABS 0.5 02/09/2022 0201   EOSABS 0.3 02/09/2022 0201   BASOSABS 0.0 02/09/2022 0201    ASSESSMENT AND PLAN:  1. Hospital discharge follow-up 2. History of hemorrhagic cerebrovascular accident (CVA) without residual deficits Clinically stable without residual deficits. Patient  to keep upcoming appointment with neurology and the neurosurgeon.  Continue aspirin as prescribed.  3. Pap smear for cervical cancer screening - Cervicovaginal ancillary only - Cytology - PAP    Patient was given the opportunity to ask questions.  Patient verbalized understanding of the plan and was able to repeat key elements of the plan.   This documentation was completed using 04/12/2022.  Any transcriptional errors are unintentional.  No orders of the defined types were placed in this encounter.    Requested Prescriptions    No prescriptions requested or ordered in this encounter    No follow-ups on file.  04/12/2022, MD, FACP

## 2022-03-09 NOTE — Progress Notes (Signed)
Pt states that she was feeling weak for wk. Pt states that she had 3 strokes in April.

## 2022-03-10 ENCOUNTER — Telehealth (HOSPITAL_COMMUNITY): Payer: Self-pay

## 2022-03-10 ENCOUNTER — Other Ambulatory Visit: Payer: Self-pay | Admitting: Internal Medicine

## 2022-03-10 LAB — CERVICOVAGINAL ANCILLARY ONLY
Bacterial Vaginitis (gardnerella): POSITIVE — AB
Candida Glabrata: NEGATIVE
Candida Vaginitis: NEGATIVE
Chlamydia: NEGATIVE
Comment: NEGATIVE
Comment: NEGATIVE
Comment: NEGATIVE
Comment: NEGATIVE
Comment: NEGATIVE
Comment: NORMAL
Neisseria Gonorrhea: NEGATIVE
Trichomonas: NEGATIVE

## 2022-03-10 MED ORDER — METRONIDAZOLE 500 MG PO TABS
500.0000 mg | ORAL_TABLET | Freq: Two times a day (BID) | ORAL | 0 refills | Status: DC
  Filled 2022-03-10 – 2022-03-23 (×2): qty 14, 7d supply, fill #0

## 2022-03-10 NOTE — Telephone Encounter (Signed)
Called to schedule pt for f/u with Dr. Quay Burow. She does not want to schedule, she said that she already has an appt with Dr. Mikal Plane. AW

## 2022-03-11 ENCOUNTER — Other Ambulatory Visit: Payer: Self-pay

## 2022-03-11 LAB — CYTOLOGY - PAP
Comment: NEGATIVE
Diagnosis: NEGATIVE
High risk HPV: NEGATIVE

## 2022-03-18 ENCOUNTER — Other Ambulatory Visit: Payer: Self-pay

## 2022-03-22 ENCOUNTER — Telehealth: Payer: Self-pay

## 2022-03-22 NOTE — Telephone Encounter (Signed)
Pt. Given lab results, will pick up prescription.

## 2022-03-23 ENCOUNTER — Other Ambulatory Visit: Payer: Self-pay

## 2022-04-08 ENCOUNTER — Ambulatory Visit (INDEPENDENT_AMBULATORY_CARE_PROVIDER_SITE_OTHER): Payer: BC Managed Care – PPO | Admitting: Psychiatry

## 2022-04-08 VITALS — BP 126/89 | HR 70 | Ht 66.0 in | Wt 221.0 lb

## 2022-04-08 DIAGNOSIS — G43109 Migraine with aura, not intractable, without status migrainosus: Secondary | ICD-10-CM | POA: Diagnosis not present

## 2022-04-08 MED ORDER — ONDANSETRON HCL 8 MG PO TABS: 8.0000 mg | ORAL_TABLET | Freq: Three times a day (TID) | ORAL | 6 refills | Status: DC | PRN

## 2022-04-08 NOTE — Patient Instructions (Addendum)
Take ondansetron every 8 hours as needed for nausea  Natural supplements that can reduce migraines: Magnesium Oxide or Magnesium Glycinate 500 mg at bed (up to 800 mg daily) Coenzyme Q10 300 mg in AM Vitamin B2- 200 mg twice a day  Add 1 supplement at a time since even natural supplements can have undesirable side effects. You can sometimes buy supplements cheaper (especially Coenzyme Q10) at www.WebmailGuide.co.za or at [employer].  Magnesium: Magnesium (250 mg twice a day or 500 mg at bed) has a relaxant effect on smooth muscles such as blood vessels. Individuals suffering from frequent or daily headache usually have low magnesium levels which can be increase with daily supplementation of 400-800 mg. Three trials found 40-90% average headache reduction  when used as a preventative. Magnesium also demonstrated the benefit in menstrually related migraine.  Magnesium is part of the messenger system in the serotonin cascade and it is a good muscle relaxant.  It is also useful for constipation. Good sources include nuts, whole grains, and tomatoes. Side Effects: loose stool/diarrhea  Riboflavin (vitamin B 2) 200 mg twice a day. This vitamin assists nerve cells in the production of ATP a principal energy storing molecule.  It is necessary for many chemical reactions in the body.  There have been at least 3 clinical trials of riboflavin using 400 mg per day all of which suggested that migraine frequency can be decreased.  All 3 trials showed significant improvement in over half of migraine sufferers.  The supplement is found in bread, cereal, milk, meat, and poultry.  Most Americans get more riboflavin than the recommended daily allowance, however riboflavin deficiency is not necessary for the supplements to help prevent headache. Side effects: energizing, green urine  Coenzyme Q10: This is present in almost all cells in the body and is critical component for the conversion of energy.  Recent studies have shown that a  nutritional supplement of CoQ10 can reduce the frequency of migraine attacks by improving the energy production of cells as with riboflavin.  Doses of 150 mg twice a day have been shown to be effective.

## 2022-04-08 NOTE — Progress Notes (Signed)
[facility]  PATIENT: Debbie Armstrong DOB: September 29, 1985  REFERRING CLINICIAN: Marvel Plan, MD HISTORY FROM: self REASON FOR VISIT: headaches, numbness   HISTORICAL  CHIEF COMPLAINT:  Chief Complaint  Patient presents with   New Patient (Initial Visit)    She states she has her good days and bad day. She states that in April she had aneurysm and strokes. She reports her migraines last most of the day, she feel some pressure and then feels drained after that. She takes Tylenol Extra Strength for relief and also Aspirin to help. She states she would like to talk to you about going on FMLA for the days that she can not work a full 11 hour shift. Room 1 alone    HISTORY OF PRESENT ILLNESS:  The patient presents for evaluation of left sided numbness which began on 02/07/2022. That day she developed numbness and tingling in her left face and arm. This lasted for about 7 hours. She presented to the ED where brain MRI showed multiple acute right cerebellar infarcts and probable prior small white matter infarcts in the left corona radiata. CTA showed her known aneurysm and no large vessel occlusion or stenosis. She had undergone an angiogram 3 days prior, and infarcts were suspected to be post-procedural. LDL was 92, A1c 4.9. TEE unremarkable. Hypercoag panel was unremarkable. She was started on ASA 81 mg daily. Stroke was felt to be unrelated as it would not explain her presenting symptoms.  She did not have a headache at the time of symptoms, however she has developed headaches since her hospitalization. They are described as left frontal pressure with associated with photophobia and nausea.  Occasionally will have mild blurred vision or see sparkles.They are severe enough that she will need to lie down. Headaches will last for a few hours at a time. Has ~2 headaches per month. Takes Tylenol for rescue which does help, but she will continue to have significant nausea. She has not had any  more episodes of numbness.   OTHER MEDICAL CONDITIONS: HPI: SAH 2/2 ACA aneurysm rupture s/p coil embolization 11/08/21, right Ica ophthalmic segment aneurysm, cerebellar infarcts  REVIEW OF SYSTEMS: Full 14 system review of systems performed and negative with exception of: headaches, numbness  ALLERGIES: Allergies  Allergen Reactions   Iodinated Contrast Media Hives and Itching    HOME MEDICATIONS: Outpatient Medications Prior to Visit  Medication Sig Dispense Refill   acetaminophen (TYLENOL) 500 MG tablet Take 1,000 mg by mouth every 6 (six) hours as needed for moderate pain.     aspirin EC 81 MG tablet Take 1 tablet (81 mg total) by mouth daily. Swallow whole. 30 tablet 12   Multiple Vitamins-Minerals (HAIR SKIN & NAILS ADVANCED) TABS Take 1 tablet by mouth daily.     metroNIDAZOLE (FLAGYL) 500 MG tablet Take 1 tablet (500 mg total) by mouth 2 (two) times daily. (Patient not taking: Reported on 04/08/2022) 14 tablet 0   No facility-administered medications prior to visit.    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 02/07/2022    PAST SURGICAL HISTORY: Past Surgical History:  Procedure Laterality Date   BUBBLE STUDY  02/11/2022   Procedure: BUBBLE STUDY;  Surgeon: Jodelle Red, MD;  Location: [facility] ENDOSCOPY;  Service: Cardiovascular;;   IR 3D INDEPENDENT WKST  11/08/2021   IR 3D INDEPENDENT WKST  02/04/2022   IR ANGIO INTRA Hermine Messick  SEL INTERNAL CAROTID BILAT MOD SED  11/08/2021   IR ANGIO INTRA EXTRACRAN SEL INTERNAL CAROTID BILAT MOD SED  02/04/2022   IR ANGIO VERTEBRAL SEL VERTEBRAL BILAT MOD SED  02/04/2022   IR ANGIO VERTEBRAL SEL VERTEBRAL UNI L MOD SED  11/08/2021   IR ANGIOGRAM FOLLOW UP STUDY  11/08/2021   IR ANGIOGRAM FOLLOW UP STUDY  11/08/2021   IR ANGIOGRAM FOLLOW UP STUDY  11/08/2021   IR NEURO EACH ADD'L AFTER BASIC UNI RIGHT (MS)  11/08/2021   IR  TRANSCATH/EMBOLIZ  11/08/2021   IR US GUIDE VASC ACCESS RIGHT  11/08/2021   IR US GUIDE VASC ACCESS RIGHT  02/04/2022   RADIOLOGY WITH ANESTHESIA N/A 11/08/2021   Procedure: INTERVENTIONAL RADIOLOGY WITH ANESTHESIA;  Surgeon: Radiologist, Medication, MD;  Location: [facility];  Service: Radiology;  Laterality: N/A;   TEE WITHOUT CARDIOVERSION N/A 02/11/2022   Procedure: TRANSESOPHAGEAL ECHOCARDIOGRAM (TEE);  Surgeon: Buford Dresser, MD;  Location: [facility] ENDOSCOPY;  Service: Cardiovascular;  Laterality: N/A;    FAMILY HISTORY: Family History  Problem Relation Age of Onset   Hypertension Mother    Diabetes Son     SOCIAL HISTORY: Social History   Socioeconomic History   Marital status: Single    Spouse name: Not on file   Number of children: 2   Years of education: Not on file   Highest education level: Some college, no degree  Occupational History   Occupation: Artist  Tobacco Use   Smoking status: Never   Smokeless tobacco: Never  Vaping Use   Vaping Use: Former  Substance and Sexual Activity   Alcohol use: Never   Drug use: Never   Sexual activity: Yes  Other Topics Concern   Not on file  Social History Narrative   Not on file   Social Determinants of Health   Financial Resource Strain: Not on file  Food Insecurity: Not on file  Transportation Needs: Not on file  Physical Activity: Not on file  Stress: Not on file  Social Connections: Not on file  Intimate Partner Violence: Not on file     PHYSICAL EXAM  GENERAL EXAM/CONSTITUTIONAL: Vitals:  Vitals:   04/08/22 1113  BP: 126/89  Pulse: 70  Weight: 221 lb (100.2 kg)  Height: 5\' 6"  (1.676 m)   Body mass index is 35.67 kg/m. Wt Readings from Last 3 Encounters:  04/08/22 221 lb (100.2 kg)  03/09/22 213 lb 12.8 oz (97 kg)  02/08/22 212 lb 11.9 oz (96.5 kg)    NEUROLOGIC: MENTAL STATUS:  awake, alert, oriented to person, place and time recent and remote memory intact normal attention and  concentration language fluent, comprehension intact, naming intact fund of knowledge appropriate  CRANIAL NERVE:  2nd, 3rd, 4th, 6th - pupils equal and reactive to light, visual fields full to confrontation, extraocular muscles intact, no nystagmus 5th - facial sensation symmetric 7th - facial strength symmetric 8th - hearing intact 9th - palate elevates symmetrically, uvula midline  MOTOR:  normal bulk and tone, full strength in the BUE, BLE  SENSORY:  normal and symmetric to light touch all 4 extremities  COORDINATION:  finger-nose-finger, heel-to-shin intact bilaterally  REFLEXES:  deep tendon reflexes present and symmetric  GAIT/STATION:  normal     DIAGNOSTIC DATA (LABS, IMAGING, TESTING) - I reviewed patient records, labs, notes, testing and imaging myself where available.  Lab Results  Component Value Date   WBC 7.1 02/09/2022   HGB 12.3 02/09/2022   HCT 37.1 02/09/2022  MCV 91.6 02/09/2022   PLT 251 02/09/2022      Component Value Date/Time   NA 138 02/11/2022 0139   K 4.1 02/11/2022 0139   CL 110 02/11/2022 0139   CO2 25 02/11/2022 0139   GLUCOSE 92 02/11/2022 0139   BUN 13 02/11/2022 0139   CREATININE 0.70 02/11/2022 0139   CALCIUM 8.7 (L) 02/11/2022 0139   PROT 5.2 (L) 02/09/2022 0201   ALBUMIN 2.8 (L) 02/09/2022 0201   AST 17 02/09/2022 0201   ALT 18 02/09/2022 0201   ALKPHOS 71 02/09/2022 0201   BILITOT 0.4 02/09/2022 0201   GFRNONAA >60 02/11/2022 0139   Lab Results  Component Value Date   CHOL 151 02/08/2022   HDL 52 02/08/2022   LDLCALC 92 02/08/2022   TRIG 37 02/08/2022   CHOLHDL 2.9 02/08/2022   Lab Results  Component Value Date   HGBA1C 4.9 02/08/2022   MRI brain 02/07/22: 1. Multiple acute right cerebellar infarcts. Additional punctate acute infarcts in the left cerebellum and lateral left temporal lobe. Mild associated edema without mass effect. Given involvement of multiple vascular territories, consider an embolic  etiology. 2. Probable prior small white matter infarcts in the left corona radiata.  CTA head 02/07/22: 1. No emergent large vessel occlusion or high-grade stenosis. 2. Status post coiling of dominant portion of A-comm aneurysm. 3. Unchanged 3 mm aneurysm at the left A1/A-comm junction and 2 mm fusiform a-comm dilatation.      ASSESSMENT AND PLAN  36 y.o. year old female with a history of SAH 2/2 ACA aneurysm rupture s/p coil embolization 11/08/21, right ICA ophthalmic segment aneurysm, cerebellar infarcts who presents for evaluation of numbness and headaches. Reviewed MRI with patient. Agree that the area of her strokes would not explain left-sided numbness. Suspect her episode in July may have been a sensory migraine aura. While she did not develop a headache in the hospital, she has gone on to develop migrainous headaches with a rare visual aura. Triptans are contraindicated due to history of stroke. She would likely benefit from a gepant for rescue. Will plan to start this 6 months after her stroke (January 2024). In the meantime, counseled that she can take magnesium daily to help prevent migraines and aura. Will prescribe Zofran to take with Tylenol for rescue.   1. Migraine with aura and without status migrainosus, not intractable       PLAN: -Start magnesium 500 mg daily for migraine/aura prevention -Rescue: Tylenol, Zofran 8 mg TID PRN for nausea   Meds ordered this encounter  Medications   ondansetron (ZOFRAN) 8 MG tablet    Sig: Take 1 tablet (8 mg total) by mouth every 8 (eight) hours as needed for nausea or vomiting.    Dispense:  20 tablet    Refill:  6    Return in about 7 months (around 11/07/2022).    Ocie Doyne, MD 04/08/22 11:53 AM  I spent an average of 33 minutes chart reviewing and counseling the patient, with at least 50% of the time face to face with the patient.   [facility] [address] [place], [place] [address] [phone]

## 2022-04-14 ENCOUNTER — Other Ambulatory Visit: Payer: Self-pay | Admitting: Obstetrics and Gynecology

## 2022-04-14 NOTE — Patient Instructions (Signed)
  Hi Ms. Madrigal, I am sorry to have missed you today - as a part of your Medicaid benefit, you are eligible for care management and care coordination services at no cost or copay. I was unable to reach you by phone today but would be happy to help you with your health related needs. Please feel free to call me at [phone].  A member of the Managed Medicaid care management team will reach out to you again over the next 30 business  days.   Kathi Der RN, BSN [place]  [employer] - Managed Medicaid High Risk [phone].

## 2022-04-14 NOTE — Patient Outreach (Signed)
Care Coordination  04/14/2022  Debbie Armstrong 12-Sep-1985 [id number]   Medicaid Managed Care   Unsuccessful Outreach Note  04/14/2022 Name: Debbie Armstrong MRN: [id number] DOB: Oct 22, 1985  Referred by: Marcine Matar, MD Reason for referral : High Risk Managed Medicaid (Unsuccessful telephone outreach)   A second unsuccessful telephone outreach was attempted today. The patient was referred to the case management team for assistance with care management and care coordination.   Follow Up Plan: The care management team will reach out to the patient again over the next 30 business  days.   Kathi Der RN, BSN [place]  [employer] - Managed Medicaid High Risk [phone]

## 2022-04-26 ENCOUNTER — Telehealth: Payer: Self-pay | Admitting: *Deleted

## 2022-04-26 DIAGNOSIS — Z0289 Encounter for other administrative examinations: Secondary | ICD-10-CM

## 2022-04-26 NOTE — Telephone Encounter (Signed)
Received form and payment today. Pt form in nurse pod.

## 2022-04-27 NOTE — Telephone Encounter (Signed)
FMLA completed by Dr Billey Gosling, sent to medical records for processing.

## 2022-05-03 NOTE — Telephone Encounter (Signed)
Completed FMLA form faxed to [employer] on 04/28/22.

## 2022-05-19 ENCOUNTER — Other Ambulatory Visit: Payer: Self-pay | Admitting: Obstetrics and Gynecology

## 2022-05-19 NOTE — Patient Outreach (Signed)
Care Coordination  05/19/2022  Debbie Armstrong 21-Jun-1986 [id number]   Medicaid Managed Care   Unsuccessful Outreach Note  05/19/2022 Name: Debbie Armstrong MRN: [id number] DOB: 1986/02/20  Referred by: Ladell Pier, MD Reason for referral : High Risk Managed Medicaid (Unsuccessful telephone outreach)   Third unsuccessful telephone outreach was attempted today. The patient was referred to the case management team for assistance with care management and care coordination. The patient's primary care provider has been notified of our unsuccessful attempts to make contact with the patient. The care management team is pleased to engage with this patient at any time in the future should he/she be interested in assistance from the care management team.   Follow Up Plan: We have been unable to make contact with the patient. The care management team is available to follow up with the patient after provider conversation with the patient regarding recommendation for care management engagement and subsequent re-referral to the care management team.   Aida Raider RN, BSN [place] Management Coordinator - Managed [place] High Risk [phone]

## 2022-05-19 NOTE — Patient Instructions (Signed)
HI Ms. Schiffer, I am sorry we have missed you - as a part of your Medicaid benefit, you are eligible for care management and care coordination services at no cost or copay. I was unable to reach you by phone today but would be happy to help you with your health related needs. Please feel free to call me at [phone]  Aida Raider RN, [place] Management Coordinator - Managed [place] High Risk [phone]

## 2022-07-09 ENCOUNTER — Ambulatory Visit: Payer: BC Managed Care – PPO | Admitting: Internal Medicine

## 2022-11-08 NOTE — Progress Notes (Unsigned)
   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  At her last visit she was recommended to start magnesium for migraine and aura prevention. Zofran was started for nausea.  Interval History:  Headaches are about the same since her last visit. She currently has 1 migraine per month which typically resolves with Tylenol. Hasn't needed to take Zofran. She has not had any further episodes of numbness/tingling. She has a history of anxiety and depression, and notes she has been feeling more down and unmotivated lately.  Migraine days per month: 1 Headache free days per month: 29  Current Headache Regimen: Preventative: none Abortive: Tylenol   Prior Therapies                                  Tylenol Triptans contraindicated due to history of stroke  Physical Exam:   Vital Signs: BP 111/73 (BP Location: Right Arm, Patient Position: Sitting, Cuff Size: Large)   Pulse 69   Ht 5\' 6"  (1.676 m)   Wt 222 lb 6.4 oz (100.9 kg)   BMI 35.90 kg/m  GENERAL:  well appearing, in no acute distress, alert  SKIN:  Color, texture, turgor normal. No rashes or lesions HEAD:  Normocephalic/atraumatic. RESP: normal respiratory effort MSK:  No gross joint deformities.   NEUROLOGICAL: Mental Status: Alert, oriented to person, place and time, Follows commands, and Speech fluent and appropriate. Cranial Nerves: PERRL, face symmetric, no dysarthria, hearing grossly intact Motor: moves all extremities equally Gait: normal-based.  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 She is currently  averaging one migraine per month which resolves with Tylenol. Does not feel she needs any other medications for migraine rescue at this time. She is interested in starting a medication that can help with both her mood and headaches. Will start Cymbalta for migraine prevention.  PLAN: -Prevention: Start Cymbalta 20 mg daily -Rescue: Continue PRN Tylenol -Next steps: consider PRN gepant for rescue  Follow-up: 8 months  I spent a total of 15 minutes on the date of the service. Headache education was done. Discussed treatment options including preventive and acute medications. Discussed medication side effects, adverse reactions and drug interactions. Written educational materials and patient instructions outlining all of the above were given.  Ocie Doyne, MD 11/09/22 1:51 PM

## 2022-11-09 ENCOUNTER — Ambulatory Visit (INDEPENDENT_AMBULATORY_CARE_PROVIDER_SITE_OTHER): Payer: BC Managed Care – PPO | Admitting: Psychiatry

## 2022-11-09 VITALS — BP 111/73 | HR 69 | Ht 66.0 in | Wt 222.4 lb

## 2022-11-09 DIAGNOSIS — G43109 Migraine with aura, not intractable, without status migrainosus: Secondary | ICD-10-CM | POA: Diagnosis not present

## 2022-11-09 MED ORDER — DULOXETINE HCL 20 MG PO CPEP: 20.0000 mg | ORAL_CAPSULE | Freq: Every day | ORAL | 6 refills | Status: DC

## 2023-01-01 ENCOUNTER — Other Ambulatory Visit: Payer: Self-pay | Admitting: Psychiatry

## 2023-01-04 NOTE — Telephone Encounter (Signed)
Pt last seen on 11/09/22 Follow up scheduled on 07/11/23 Pharmacy requesting 90 day supply

## 2023-03-17 ENCOUNTER — Telehealth: Payer: Self-pay | Admitting: Psychiatry

## 2023-03-17 ENCOUNTER — Encounter: Payer: Self-pay | Admitting: Psychiatry

## 2023-03-17 NOTE — Telephone Encounter (Signed)
LVM and sent letter in mail informing pt of need to reschedule 07/11/23 appt - MD leaving practice

## 2023-07-11 ENCOUNTER — Encounter: Payer: Self-pay | Admitting: Neurology

## 2023-07-11 ENCOUNTER — Ambulatory Visit: Payer: BC Managed Care – PPO | Admitting: Psychiatry

## 2023-07-11 ENCOUNTER — Ambulatory Visit (INDEPENDENT_AMBULATORY_CARE_PROVIDER_SITE_OTHER): Payer: BC Managed Care – PPO | Admitting: Neurology

## 2023-07-11 VITALS — BP 120/89 | HR 58 | Ht 66.0 in | Wt 207.0 lb

## 2023-07-11 DIAGNOSIS — Z8673 Personal history of transient ischemic attack (TIA), and cerebral infarction without residual deficits: Secondary | ICD-10-CM | POA: Diagnosis not present

## 2023-07-11 DIAGNOSIS — Z8679 Personal history of other diseases of the circulatory system: Secondary | ICD-10-CM | POA: Diagnosis not present

## 2023-07-11 NOTE — Patient Instructions (Signed)
Treatment plan and additional workup planned after today includes:   1)  I would not recommend triptans for migraine treatment, only ubrelvy and nurtec . No vaso-constricting meds.  2)  patient is only on tylenol, no asa no plavix given.   No PT / OT / ST needed.  Yearly RV with Np.  In case of acute worsening of severe HA, mental status changes, please come to the hospital ASAP.  Inform your colleagues and family, friends. Cerebral Aneurysm  A cerebral aneurysm is a bulge in a blood vessel (artery) in the brain. This happens when a weak part of a blood vessel expands due to the constant pressure of blood flowing through the blood vessel. As the aneurysm expands, the walls of the blood vessel become weaker. Aneurysms are dangerous because they can leak or burst (rupture). When a cerebral aneurysm ruptures, it causes bleeding in the brain (hemorrhage). The blood flow to the area of the brain supplied by the artery is also reduced. This is a kind of stroke. A ruptured cerebral aneurysm is a medical emergency. This can cause permanent brain damage or death. What are the causes? The exact cause of this condition is not known. What increases the risk? Your health care provider will talk with you about risks. These may include: Having high blood pressure (hypertension). Smoking. Being older. The condition is most common in people between the ages of 55 and 67. Being female. Having a direct family history of aneurysm. Having certain blood vessel disorders, such as: Fibromuscular dysplasia. Cerebral arteritis. Arterial dissection. Infections. Use of recreational drugs. Brain trauma. What are the signs or symptoms? The symptoms of a cerebral aneurysm that has not leaked or burst can depend on its size, shape, location, and rate of growth. A smaller, unchanging aneurysm does not usually cause symptoms. A larger, growing aneurysm can increase pressure on the brain or nerves. This increased  pressure can cause: A headache. Vision problems. Numbness or weakness in an arm or leg. Memory problems. Problems speaking. Seizures. If an aneurysm leaks or bursts, it can cause a condition called a hemorrhagic stroke. This can be fatal. Symptoms may include: A sudden, severe headache with no known cause. This is often described as the worst headache ever experienced. Stiff neck. Nausea or vomiting, especially when combined with other symptoms, such as a headache. Sudden weakness or numbness of the face, arm, or leg, especially on one side of the body. You may notice a drooping of one side of the face. Sudden trouble walking or difficulty moving your arms or legs. Double vision or sudden trouble seeing in one or both eyes. Trouble speaking or understanding speech. Trouble swallowing. Dizziness. Loss of balance or coordination. Needing to stay away from light. Sudden confusion or loss of consciousness. How is this diagnosed? This condition is diagnosed using certain tests, including: CT scan. Computed tomographic angiogram (CTA). This test uses a dye and a scanner to produce images of your blood vessels. Magnetic resonance angiogram (MRA). This test uses an MRI machine to produce images of your blood vessels. Diagnostic cerebral angiogram (DSA). This test involves placing a soft tube (catheter) into the artery in your thigh and guiding it up to the arteries in the brain. A dye is then injected and X-rays are taken to create images of your blood vessels. How is this treated? Unruptured aneurysm Treatment for an aneurysm that is not causing problems will depend on many factors. These may be the size and location of the  aneurysm, your age, your overall health, and your preferences. Small aneurysms in certain locations of the brain have a very low chance of bleeding or rupturing. These small aneurysms may not need to be treated. For these, your provider may monitor the aneurysm regularly to  check for any changes. In some cases, treatment may be required. Treatment options may include: Coiling. During this procedure, a catheter is inserted and moved through a blood vessel. Once the catheter reaches the aneurysm, tiny coils are used to block blood flow into the aneurysm. This procedure is sometimes done at the same time as a DSA. Surgical clipping. During surgery, a clip is placed at the base of the aneurysm. The clip prevents blood from entering the aneurysm. Flow diversion. This procedure is used to divert blood flow around the aneurysm with a stent that is placed across the opening of an aneurysm. Ruptured aneurysm For a ruptured aneurysm, emergency surgery or coiling is often needed right away to help reduce damage to the brain and to reduce the risk of more bleeding. Follow these instructions at home: If your aneurysm is not treated: Take over-the-counter and prescription medicines only as told by your provider. Follow a diet suggested by your provider. Diet changes may be advised to address hypertension, such as choosing foods that are low in salt (sodium), saturated fat, trans fat, and cholesterol. Talk to your provider about what activities are safe for you. Talk to your provider about ways to lower stress with meditation or yoga. Do not use any products that contain nicotine or tobacco before the procedure. These products include cigarettes, chewing tobacco, and vaping devices, such as e-cigarettes. If you need help quitting, ask your provider. If you drink alcohol: Limit how much you have to: 0-1 drink a day if you are female. 0-2 drinks a day if you are female. Know how much alcohol is in your drink. In the [place] one drink is one 12 oz bottle of beer (355 mL), one 5 oz glass of wine (148 mL), or one 1 oz glass of hard liquor (44 mL). Do not use drugs. If you need help quitting, ask your provider. Keep all follow-up visits. This includes any referrals, imaging studies, and  lab tests. Proper follow-up may prevent an aneurysm rupture or a stroke. Get help right away if: You have a sudden, severe headache with no known cause. This may include a stiff neck. You have sudden nausea or vomiting with a severe headache. You have a seizure. You have other symptoms of a stroke. "BE FAST" is an easy way to remember the main warning signs of a stroke: B - Balance. Signs are dizziness, sudden trouble walking, or loss of balance. E - Eyes. Signs are trouble seeing or a sudden change in vision. F - Face. Signs are sudden weakness or numbness of the face, or the face or eyelid drooping on one side. A - Arms. Signs are weakness or numbness in an arm. This happens suddenly and usually on one side of the body. S - Speech. Signs are sudden trouble speaking, slurred speech, or trouble understanding what people say. T - Time. Time to call emergency services. Write down what time symptoms started. These symptoms may be an emergency. Get help right away. Call 911. Do not wait to see if the symptoms will go away. Do not drive yourself to the hospital. This information is not intended to replace advice given to you by your health care provider. Make sure you discuss any  questions you have with your health care provider. Document Revised: 06/07/2022 Document Reviewed: 06/07/2022 Elsevier Patient Education  2024 [employer].

## 2023-07-11 NOTE — Progress Notes (Signed)
[facility]  Provider:  Dr Rennie Rouch Referring Provider: Marcine Matar, MD   Primary Care Physician:  Dr. Roda Shutters  Chief Complaint  Patient presents with   Follow-up THIS IS A FORMER DR [facility] / Dr Roda Shutters Stroke Patient here for following on 3 aneurysmata in an angiogram, she had 3 brain bleeds.     Pt in 2 Pt here for migraine f/u Pt states Migraines are worse during menstrual cycle  That is the time when her let arm gets numb.    HPI:  Debbie Armstrong is a 37 y.o. female of [place] descent and seen here upon TOC/ referral from Dr. Laural Benes for a Consultation/ Evaluation of brain aneurysm, leading to  headaches, numbness .  Aneurysm bleed was discovered 11-08-2021.  See assessment and  Plan notes. There was a prolonged response in 02-07-2022 with punctate strokes in the left cerebellum.   Now on cymbalta, advil and tylenol only.      Dr Quentin Mulling note:   The patient presents for evaluation of left sided numbness which began on 02/07/2022. That day she developed numbness and tingling in her left face and arm. This lasted for about 7 hours. She presented to the ED where brain MRI showed multiple acute right cerebellar infarcts and probable prior small white matter infarcts in the left corona radiata. CTA showed her known aneurysm and no large vessel occlusion or stenosis. She had undergone an angiogram 3 days prior, and infarcts were suspected to be post-procedural. LDL was 92, A1c 4.9. TEE unremarkable. Hypercoag panel was unremarkable. She was started on ASA 81 mg daily. Stroke was felt to be unrelated as it would not explain her presenting symptoms.   She did not have a headache at the time of symptoms, however she has developed headaches since her hospitalization. They are described as left frontal pressure with associated with photophobia and nausea.  Occasionally will have mild blurred vision or see sparkles.They are severe enough that she will need to lie down. Headaches will  last for a few hours at a time. Has ~2 headaches per month. Takes Tylenol for rescue which does help, but she will continue to have significant nausea. She has not had any more episodes of numbness.    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 She states that in April she had aneurysm and strokes. She reports her migraines last most of the day, she feel some pressure and then feels drained after that. She takes Tylenol Extra Strength for relief and also Aspirin to help. She states she would like to talk to you about going on FMLA for the days that she can not work a full 11 hour shift. Room 1 alone    Review of Systems: Out of a complete 14 system review, the patient complains of only the following symptoms, and all other reviewed systems are negative.  Left hand numbness, left hand mild tremor with action, left arm loss of reflexes.    Social History   Socioeconomic History   Marital status: Single    Spouse name: Not on file   Number of children: 2   Years of education: Not on file   Highest education level: Some college, no degree  Occupational History   Occupation: Packing and shipping  Tobacco Use   Smoking status: Never   Smokeless tobacco: Never  Vaping  Use   Vaping status: Former  Substance and Sexual Activity   Alcohol use: Yes    Comment: occ   Drug use: Never   Sexual activity: Yes  Other Topics Concern   Not on file  Social History Narrative   Live with parents and sister    Pt works a Radiographer, therapeutic of [employer] Strain: Not on file  Food Insecurity: Not on file  Transportation Needs: Not on file  Physical Activity: Not on file  Stress: Not on file  Social Connections: Unknown (02/09/2022)   Received from [facility], [facility]   Social Network    Social Network: Not on  file  Intimate Partner Violence: Unknown (02/09/2022)   Received from [facility], [facility]   HITS    Physically Hurt: Not on file    Insult or Talk Down To: Not on file    Threaten Physical Harm: Not on file    Scream or Curse: Not on file    Family History  Problem Relation Age of Onset   Hypertension Mother    Diabetes Son    Migraines Neg Hx     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 02/07/2022    Past Surgical History:  Procedure Laterality Date   BUBBLE STUDY  02/11/2022   Procedure: BUBBLE STUDY;  Surgeon: Jodelle Red, MD;  Location: [facility] ENDOSCOPY;  Service: Cardiovascular;;   IR 3D INDEPENDENT WKST  11/08/2021   IR 3D INDEPENDENT WKST  02/04/2022   IR ANGIO INTRA EXTRACRAN SEL INTERNAL CAROTID BILAT MOD SED  11/08/2021   IR ANGIO INTRA EXTRACRAN SEL INTERNAL CAROTID BILAT MOD SED  02/04/2022   IR ANGIO VERTEBRAL SEL VERTEBRAL BILAT MOD SED  02/04/2022   IR ANGIO VERTEBRAL SEL VERTEBRAL UNI L MOD SED  11/08/2021   IR ANGIOGRAM FOLLOW UP STUDY  11/08/2021   IR ANGIOGRAM FOLLOW UP STUDY  11/08/2021   IR ANGIOGRAM FOLLOW UP STUDY  11/08/2021   IR NEURO EACH ADD'L AFTER BASIC UNI RIGHT (MS)  11/08/2021   IR TRANSCATH/EMBOLIZ  11/08/2021   IR US GUIDE VASC ACCESS RIGHT  11/08/2021   IR US GUIDE VASC ACCESS RIGHT  02/04/2022   RADIOLOGY WITH ANESTHESIA N/A 11/08/2021   Procedure: INTERVENTIONAL RADIOLOGY WITH ANESTHESIA;  Surgeon: Radiologist, Medication, MD;  Location: [facility] OR;  Service: Radiology;  Laterality: N/A;   TEE WITHOUT CARDIOVERSION N/A 02/11/2022   Procedure: TRANSESOPHAGEAL ECHOCARDIOGRAM (TEE);  Surgeon: Jodelle Red, MD;  Location: [facility] ENDOSCOPY;  Service: Cardiovascular;  Laterality: N/A;    Current Outpatient Medications  Medication Sig Dispense Refill   acetaminophen (TYLENOL) 500 MG tablet Take 1,000 mg by mouth as needed for moderate pain (pain score  4-6).     ibuprofen (ADVIL) 100 MG/5ML suspension Take 200 mg by mouth every 4 (four) hours as needed.     DULoxetine (CYMBALTA) 20 MG capsule TAKE 1 CAPSULE BY MOUTH EVERY DAY 90 capsule 1   No current facility-administered medications for this visit.    Allergies as of 07/11/2023 - Review Complete 07/11/2023  Allergen Reaction Noted   Iodinated contrast media Hives and Itching 02/08/2022    37 year old female presenting with aneurysmal rupture pattern of subarachnoid hemorrhage yesterday. 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 3 mm paraophthalmic aneurysm.   EXAM: CT HEAD WITHOUT CONTRAST  TECHNIQUE: Contiguous axial images were obtained from the base of the skull through the vertex without intravenous contrast.   RADIATION DOSE REDUCTION: This exam was performed according to the departmental dose-optimization program which includes automated exposure control, adjustment of the mA and/or kV according to patient size and/or use of iterative reconstruction technique.   COMPARISON:  Presentation head CT 11/08/2021.   FINDINGS: Brain: Aneurysmal pattern subarachnoid hemorrhage stable since yesterday, relatively sparing the cisterna magna and convexities.   Small volume of IVH layering now in the lateral ventricles. No ventriculomegaly. No midline shift.   No cortically based acute infarct identified. Stable gray-white matter differentiation throughout the brain.   Unusual configuration of the anterior 3rd ventricle and suprasellar cistern, as well as prominent scattered dural calcifications. Some of the suprasellar cistern anatomy might be obscured by isodense blood also.   Vascular: Multiple small anterior communicating artery region aneurysm coil packs now. No suspicious intracranial vascular hyperdensity.   Skull: No acute osseous abnormality identified.   Sinuses/Orbits:  Visualized paranasal sinuses and mastoids are stable and well aerated.   Other: Negative orbit and scalp soft tissues.   IMPRESSION: 1. Stable Aneurysmal pattern SAH since presentation. Trace IVH with no ventriculomegaly or significant intracranial mass effect. 2. Several small new anterior communicating artery region endovascular coil packs. 3. No new intracranial abnormality.     Electronically Signed   By: Odessa Fleming M.D.   On: 11/09/2021 05:20  Vitals: BP 120/89   Pulse (!) 58   Ht 5\' 6"  (1.676 m)   Wt 207 lb (93.9 kg)   BMI 33.41 kg/m  Last Weight:  Wt Readings from Last 1 Encounters:  07/11/23 207 lb (93.9 kg)   Last Height:   Ht Readings from Last 1 Encounters:  07/11/23 5\' 6"  (1.676 m)   Last BMI: @LASTBMI  Physical exam:  General: The patient is awake, alert and appears not in acute distress.  The patient is well groomed. Head: Normocephalic, atraumatic.  Neck is supple.  Cardiovascular:  Regular rate and palpable peripheral pulse:  Respiratory: clear to auscultation.  Mallampati 2, Skin:  Without evidence of edema, or rash Trunk: BMI is  and patient  has normal posture.   Neurologic exam : The patient is awake and alert, oriented to place and time.  Memory subjective  described as intact.  There is a normal attention span & concentration ability.  Speech is fluent without  dysarthria, dysphonia or aphasia.  Mood and affect are appropriate.  Cranial nerves: Pupils are equal and briskly reactive to light. Funduscopic exam without  evidence of pallor or edema. Extraocular movements  in vertical and horizontal planes intact and without nystagmus. Visual fields by finger perimetry are intact. Hearing to finger rub intact.  Facial sensation intact to fine touch. Facial motor strength is symmetric and tongue and uvula move midline.  Motor exam:   Normal tone and normal muscle bulk and symmetric normal strength in all extremities. Grip Strength slightly weaker  on the left.  Proximal strength of shoulder muscles and hip flexors was intact   Sensory:  Fine touch and vibration were tested . Proprioception was tested in the upper extremities only- normal.  Coordination: Rapid alternating movements in the fingers/hands were normal.  Finger-to-nose maneuver was tested and showed no evidence of ataxia, dysmetria , but left sided mild action  tremor.  Gait and station: Patient walked without assistive device . Core Strength within normal limits. Stance is stable and of normal base.  Tandem gait  is intact , turns with 3 Steps are unfragmented.  Romberg testing is normal.  Deep tendon reflexes: in the  upper and lower extremities are attenuated on the left, patella could not be elicited nor biceps.    Assessment: Total time for face to face interview and examination, for review of  images and laboratory testing, neurophysiology testing and pre-existing records, including out-of -network , was 45 minutes. Assessment is as follows here: IMPRESSION: 1. No emergent large vessel occlusion or high-grade stenosis. 2. Status post coiling of dominant portion of A-comm aneurysm. 3. Unchanged 3 mm aneurysm at the left A1/A-comm junction and 2 mm fusiform a-comm dilatation.   On: 02/07/2022 23:18  1)   History of SAH related to aneurysmata bleed.  Remote cerebellar infarctions were seen at the time of the bleed.   2) very minor lasting  symptoms, and not disabling.  Headaches are less frequent, they are  not long lasting and cluster into the time of her period . Her left sided numbness is stronger felt in the same time.  She presented again on 02-08-2023 s a code stroke, we reviewed her MRI :  Multiple acute right cerebellar infarcts. Additional punctate acute infarcts in the left cerebellum and lateral left temporal lobe. Mild DWI hyperintensity in the left corona radiata is favored to represent T2 shine through given no ADC correlate and T2 hyperintensities in  this region that probably correlate with prior white matter infarcts. No acute hemorrhage, mass lesion, midline shift, hydrocephalus, or extra-axial fluid collection.   Vascular: Major arterial flow voids are maintained at the skull base.   Skull and upper cervical spine: Normal marrow signal.   Sinuses/Orbits: Clear sinuses.  No acute orbital findings.   Other: No mastoid effusions.   IMPRESSION: 1. Multiple acute right cerebellar infarcts. Additional punctate acute infarcts in the left cerebellum and lateral left temporal lobe. Mild associated edema without mass effect. Given involvement of multiple vascular territories, consider an embolic etiology. 2. Probable prior small white matter infarcts in the left corona radiata.  Echo with bubble : IMPRESSIONS 1. Left ventricular ejection fraction, by estimation, is 60 to 65% . The left ventricle has normal function. 2. Right ventricular systolic function is normal. The right ventricular size is normal. 3. No left atrial/ left atrial appendage thrombus was detected. 4. The mitral valve is normal in structure. No evidence of mitral valve regurgitation. No evidence of mitral stenosis. 5. The aortic valve is tricuspid. Aortic valve regurgitation is not visualized. No aortic stenosis is present. 6. Agitated saline contrast bubble study was positive with shunting observed after > 6 cardiac cycles suggestive of intrapulmonary shunting.  Conclusion( s) / Recommendation( s) : No LA/ LAA thrombus identified. Negative bubble study for interatrial shunt. No intracardiac source of embolism detected on this on this transesophageal echocardiogram.   No PFO/ ASD by color, and bubble study was late positive. Suggests no intra- atrial     Plan:  Treatment plan and additional workup planned after today includes:   1)  I would not recommend triptans for migraine treatment, only ubrelvy and nurtec . No vaso-constricting meds.  2)  patient is only on  tylenol, no asa no plavix given.   No PT / OT / ST needed.  Yearly RV with Np.  In case of acute worsening of severe HA, mental status changes, please come to the hospital ASAP.  Inform your colleagues and family, friends.     Melvyn Novas , MD   Rv in  12 months

## 2023-10-04 IMAGING — CT CT HEAD W/O CM
3 series · 16 of 37 positions shown, 18 images · non-contrast
Comparison: Presentation head CT 11/08/2021.

CLINICAL DATA: 35-year-old female presenting with aneurysmal
rupture pattern of subarachnoid hemorrhage yesterday. Several small
anterior communicating artery aneurysms on diagnostic cerebral
angiogram crit treated with coil embolization. 3 mm paraophthalmic
aneurysm.



[Series 3: head without · axial · non-contrast · 0.47mm/px · z∈[-178,-53]mm · 7 of 35 slices shown, 9 images]
[im 5/35  brain]
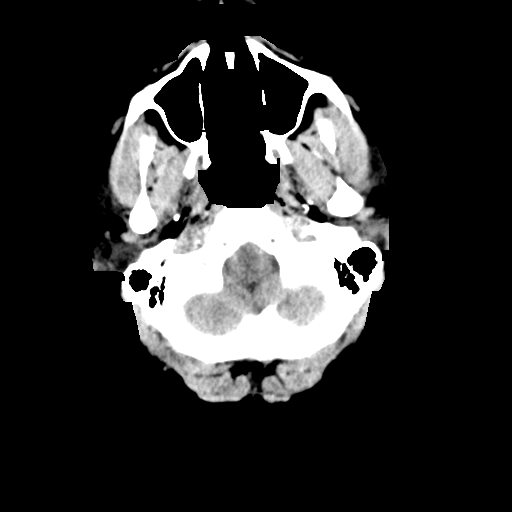
[im 5/35  bone]
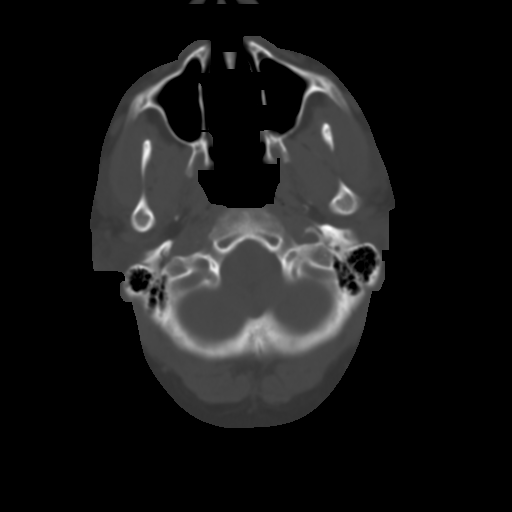
[im 9/35  brain]
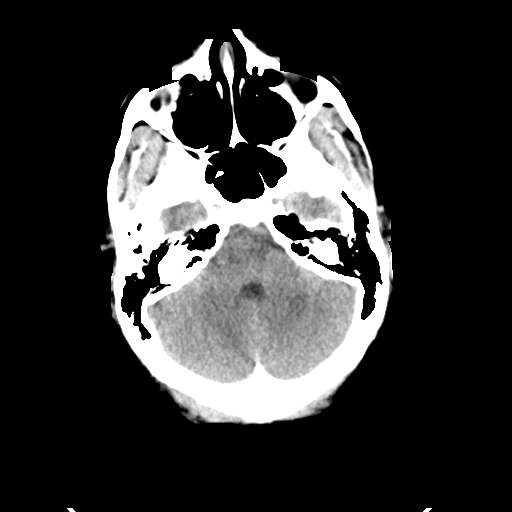
[im 13/35  brain]
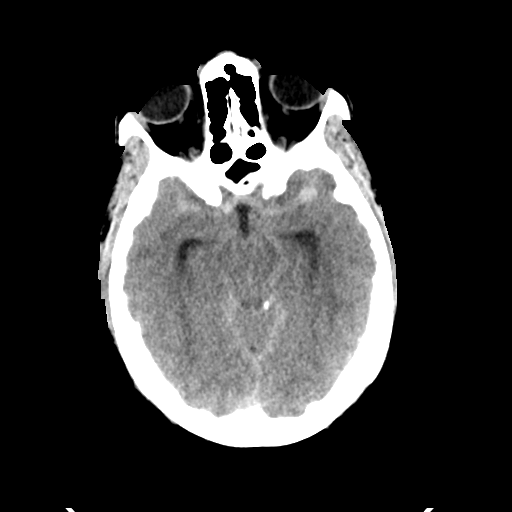
[im 18/35  brain]
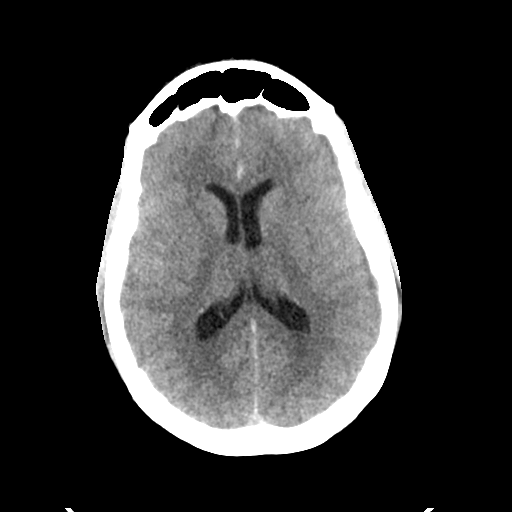
[im 22/35  brain]
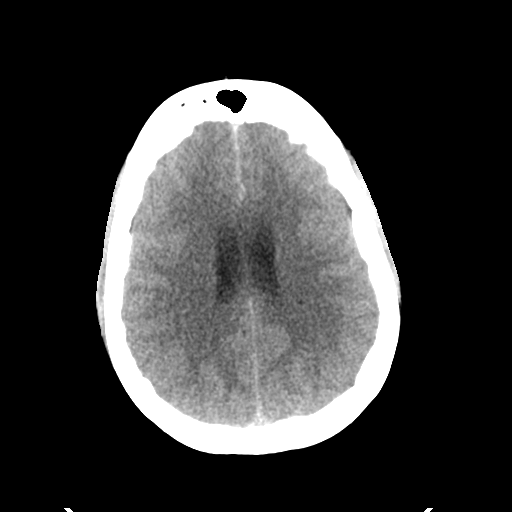
[im 22/35  bone]
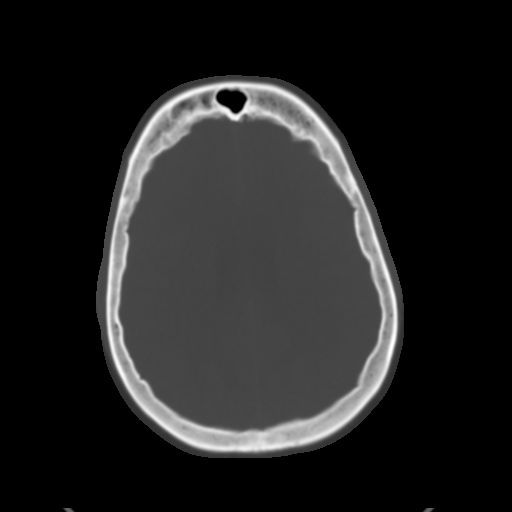
[im 26/35  brain]
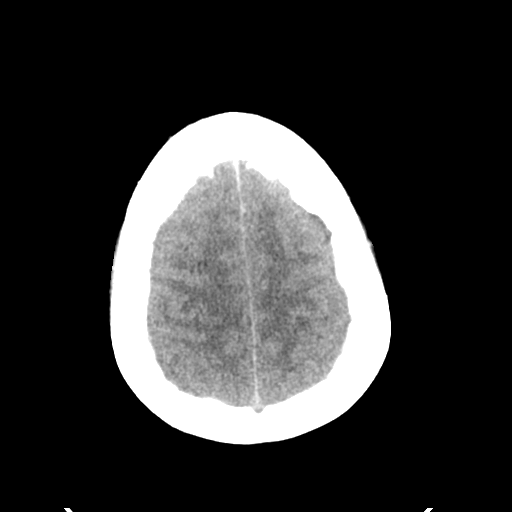
[im 30/35  brain]
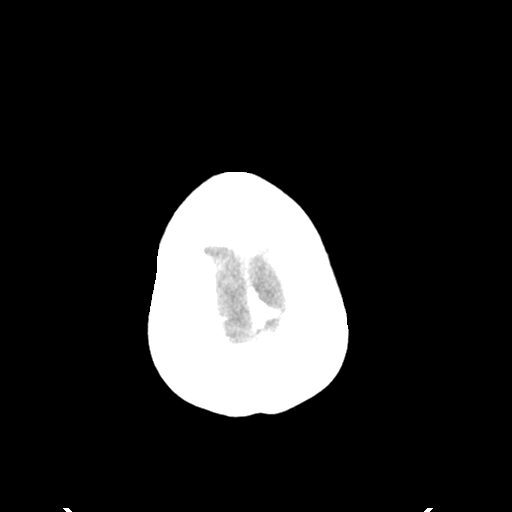

[Series 4: head bone · axial · 0.47mm/px · z∈[-182,-80]mm · 6 of 86 slices shown]
[im 9/86  bone]
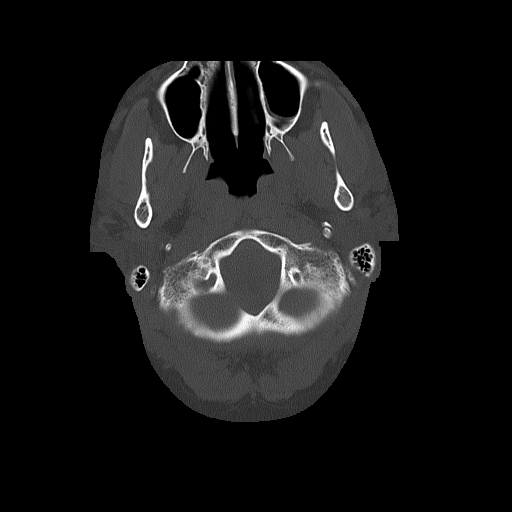
[im 18/86  bone]
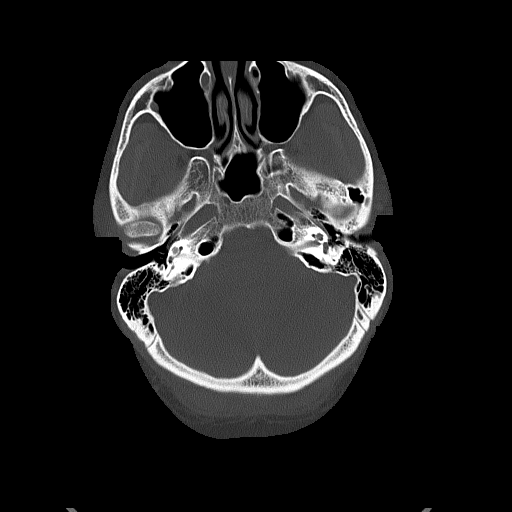
[im 26/86  bone]
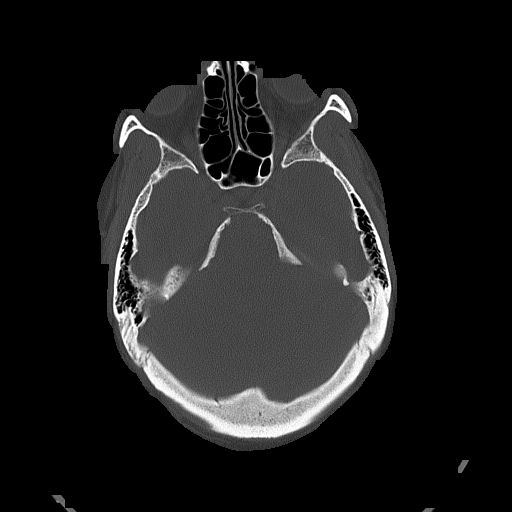
[im 39/86  bone]
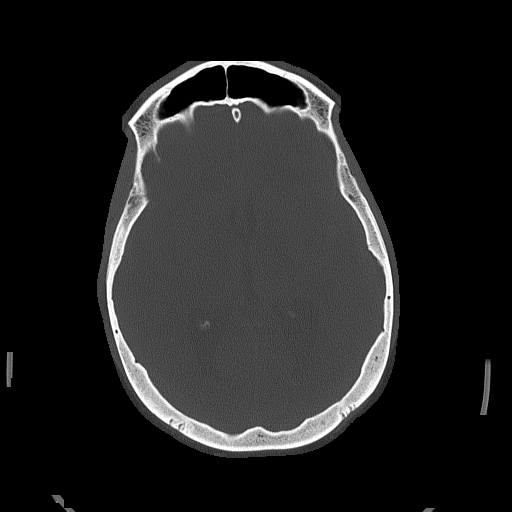
[im 47/86  bone]
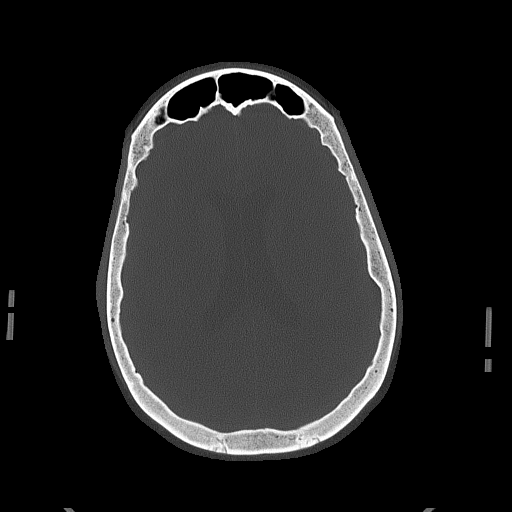
[im 60/86  bone]
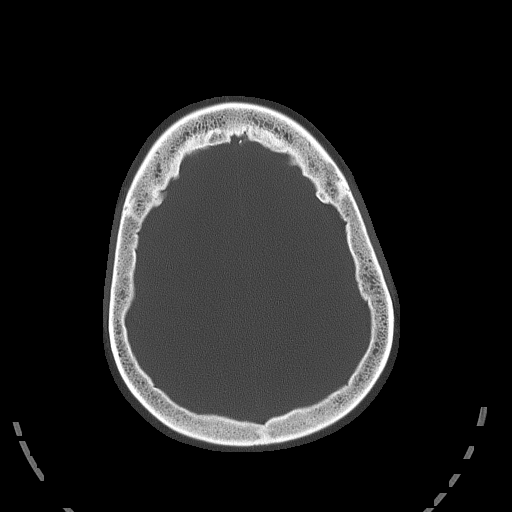

[Series 6: head without sag · sagittal · non-contrast · 0.36mm/px · 3 of 56 slices shown]
[im 19/56  brain]
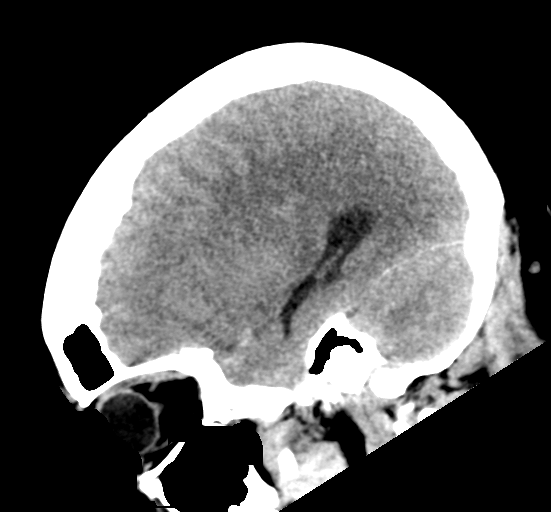
[im 28/56  brain]
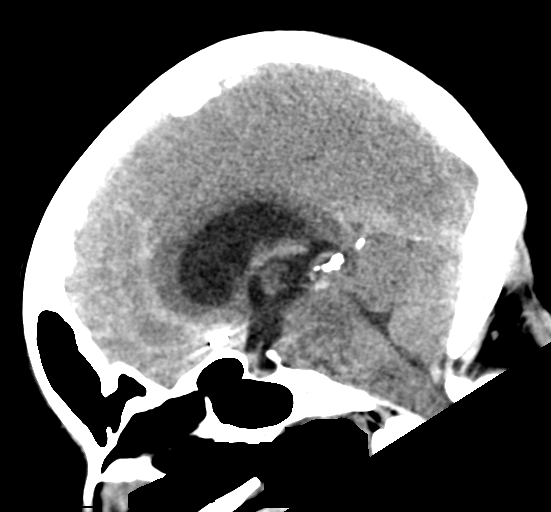
[im 37/56  brain]
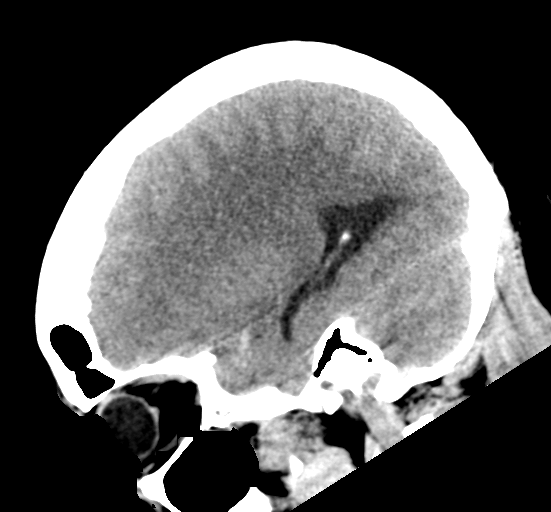

[16 of 37 positions shown; findings below may reference images not displayed]

FINDINGS: Brain: Aneurysmal pattern subarachnoid hemorrhage stable since
yesterday, relatively sparing the cisterna magna and convexities.

Small volume of IVH layering now in the lateral ventricles. No
ventriculomegaly. No midline shift.

No cortically based acute infarct identified. Stable gray-white
matter differentiation throughout the brain.

Unusual configuration of the anterior 3rd ventricle and suprasellar
cistern, as well as prominent scattered dural calcifications. Some
of the suprasellar cistern anatomy might be obscured by isodense
blood also.

Vascular: Multiple small anterior communicating artery region
aneurysm coil packs now. No suspicious intracranial vascular
hyperdensity.

Skull: No acute osseous abnormality identified.

Sinuses/Orbits: Visualized paranasal sinuses and mastoids are stable
and well aerated.

Other: Negative orbit and scalp soft tissues.
IMPRESSION: 1. Stable Aneurysmal pattern CENTRALQA since presentation. Trace IVH with
no ventriculomegaly or significant intracranial mass effect.
2. Several small new anterior communicating artery region
endovascular coil packs.
3. No new intracranial abnormality.

## 2024-01-12 ENCOUNTER — Telehealth: Payer: Self-pay

## 2024-01-12 NOTE — Telephone Encounter (Signed)
 Received [employer] form for completion. Sent mychart message to patient regarding form fee.

## 2024-01-16 DIAGNOSIS — Z0289 Encounter for other administrative examinations: Secondary | ICD-10-CM

## 2024-01-19 NOTE — Telephone Encounter (Signed)
$  50 form payment made 6/16. Placed in POD 3 for completion

## 2024-01-26 NOTE — Telephone Encounter (Signed)
 Spoke w/Pt regarding FMLA request and paperwork. Pt stated she needs intermittent FMLA for days when she has HA. Stated she has approx 2 HA per month and needs 2 days each time and would like FMLA to reflect that. Pt stated has had FMLA request completed by this office in the past with a different provider and the accommodation was similar to what she is requesting now. Discussed w/Pt in OV notes from last visit, provider did not indicate a need for FMLA and that provider is currently out of the office until July 7. Will need to have work-in provider look over notes and paperwork to see if they are willing to sign in Dr. Lionell absence. Informed will make Pt aware the decision either way. Pt voiced understanding.

## 2024-01-26 NOTE — Telephone Encounter (Signed)
 Attempted to call Pt for information regarding FMLA forms. No answer, LVM to call back.

## 2024-02-06 ENCOUNTER — Telehealth: Payer: Self-pay | Admitting: *Deleted

## 2024-02-06 NOTE — Telephone Encounter (Signed)
 Pt [employer] form faxed on 02/06/2024 to [phone]

## 2024-02-06 NOTE — Telephone Encounter (Signed)
 Paperwork completed and given to medical records for the pt

## 2024-02-09 NOTE — Telephone Encounter (Signed)
 Patient stated, [employer] called yesterday to notified paperwork faxed over; there was a part missing. Asking finished filling out and fax back. Only have 9 days left for paperwork to be turned in.

## 2024-02-14 ENCOUNTER — Telehealth: Payer: Self-pay | Admitting: *Deleted

## 2024-02-14 ENCOUNTER — Other Ambulatory Visit: Payer: Self-pay | Admitting: Neurology

## 2024-02-14 NOTE — Telephone Encounter (Signed)
 Will resend the form once more along with office notes.

## 2024-02-14 NOTE — Telephone Encounter (Signed)
 Pt called need to have her [employer] form corrected. Please call [phone]

## 2024-02-14 NOTE — Telephone Encounter (Signed)
 Pt [employer] form updated and faxed back to [employer] with the last office note.

## 2024-02-14 NOTE — Telephone Encounter (Signed)
 Provided updated paperwork and ov notes to Adrien and she will resend for the pt

## 2024-07-16 ENCOUNTER — Encounter: Payer: Self-pay | Admitting: Adult Health

## 2024-07-16 ENCOUNTER — Ambulatory Visit: Payer: BC Managed Care – PPO | Admitting: Adult Health

## 2024-07-16 VITALS — BP 128/78 | HR 61 | Ht 66.0 in | Wt 196.0 lb

## 2024-07-16 DIAGNOSIS — G43829 Menstrual migraine, not intractable, without status migrainosus: Secondary | ICD-10-CM

## 2024-07-16 DIAGNOSIS — G43109 Migraine with aura, not intractable, without status migrainosus: Secondary | ICD-10-CM

## 2024-07-16 DIAGNOSIS — Z8679 Personal history of other diseases of the circulatory system: Secondary | ICD-10-CM

## 2024-07-16 DIAGNOSIS — Z8673 Personal history of transient ischemic attack (TIA), and cerebral infarction without residual deficits: Secondary | ICD-10-CM | POA: Diagnosis not present

## 2024-07-16 DIAGNOSIS — I671 Cerebral aneurysm, nonruptured: Secondary | ICD-10-CM | POA: Diagnosis not present

## 2024-07-16 MED ORDER — NURTEC 75 MG PO TBDP: 75.0000 mg | ORAL_TABLET | ORAL | 11 refills | Status: AC | PRN

## 2024-07-16 MED ORDER — ONDANSETRON 4 MG PO TBDP: 4.0000 mg | ORAL_TABLET | Freq: Three times a day (TID) | ORAL | 11 refills | Status: AC | PRN

## 2024-07-16 NOTE — Patient Instructions (Addendum)
 Your Plan:  Try Nurtec to take 2 days prior to onset of menstrual cycle and continue to total of 5 days. If no benefit, please let me know and we can try Ubrelvy.   Will follow up with Dr. Rosemarie regarding need of aspirin  and repeat imaging vs referring you to re establish care with IR     Follow up in 1 year or call earlier if needed     Thank you for coming to see us  at [facility]. I hope we have been able to provide you high quality care today.  You may receive a patient satisfaction survey over the next few weeks. We would appreciate your feedback and comments so that we may continue to improve ourselves and the health of our patients.

## 2024-07-16 NOTE — Progress Notes (Signed)
 CC:  headaches  Follow-up Visit  Last visit: 07/11/2023 with Dr. 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  At prior visit, overall stable and recommended continuation of Tylenol  or ibuprofen  as needed for migraine rescue, declined use of Ubrelvy or Nurtec.   Interval History:  Returns for migraine follow up. Reports continued migraines typically around menstrual cycle. Has about 3 migraines per month, usually 3 days prior to onset of cycle. Previously resolved with use of Tylenol  but has not been as effective recently.  She has also been experiencing increased nausea with her migraines. She is not currently on any preventative medications.     Migraine days per month: 3 Headache free days per month: 27  Current Headache Regimen: Preventative: none Abortive: Tylenol    Prior Therapies                                  Tylenol  Triptans contraindicated due to history of stroke Duloxetine     ROS:   14 system review of systems performed and negative with exception of those listed in HPI   Outpatient Encounter Medications as of 07/16/2024  Medication Sig   acetaminophen  (TYLENOL ) 500 MG tablet Take 1,000 mg by mouth as needed for moderate pain (pain score 4-6).   ibuprofen  (ADVIL ) 100 MG/5ML suspension Take 200 mg by mouth every 4 (four) hours as needed.   ondansetron  (ZOFRAN -ODT) 4 MG disintegrating tablet Take 1 tablet (4 mg total) by mouth every 8 (eight) hours as needed (migraine nausea).   Rimegepant Sulfate (NURTEC) 75 MG TBDP Take 1 tablet (75 mg total) by mouth as needed (as needed for migraine rescue).   [DISCONTINUED] DULoxetine  (CYMBALTA ) 20 MG capsule TAKE 1 CAPSULE BY MOUTH EVERY DAY (Patient not taking: Reported on 07/16/2024)    No facility-administered encounter medications on file as of 07/16/2024.   Past Medical History:  Diagnosis Date   Aneurysm    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 02/07/2022   Past Surgical History:  Procedure Laterality Date   BUBBLE STUDY  02/11/2022   Procedure: BUBBLE STUDY;  Surgeon: Lonni Slain, MD;  Location: [facility] ENDOSCOPY;  Service: Cardiovascular;;   IR 3D INDEPENDENT WKST  11/08/2021   IR 3D INDEPENDENT WKST  02/04/2022   IR ANGIO INTRA EXTRACRAN SEL INTERNAL CAROTID BILAT MOD SED  11/08/2021   IR ANGIO INTRA EXTRACRAN SEL INTERNAL CAROTID BILAT MOD SED  02/04/2022   IR ANGIO VERTEBRAL SEL VERTEBRAL BILAT MOD SED  02/04/2022   IR ANGIO VERTEBRAL SEL VERTEBRAL UNI L MOD SED  11/08/2021   IR ANGIOGRAM FOLLOW UP STUDY  11/08/2021   IR ANGIOGRAM FOLLOW UP STUDY  11/08/2021   IR ANGIOGRAM FOLLOW UP STUDY  11/08/2021   IR NEURO EACH ADD'L AFTER BASIC UNI RIGHT (MS)  11/08/2021   IR TRANSCATH/EMBOLIZ  11/08/2021   IR US  GUIDE VASC ACCESS RIGHT  11/08/2021   IR US  GUIDE VASC ACCESS RIGHT  02/04/2022   RADIOLOGY WITH ANESTHESIA N/A 11/08/2021   Procedure: INTERVENTIONAL RADIOLOGY WITH ANESTHESIA;  Surgeon: Radiologist, Medication, MD;  Location: [facility] OR;  Service: Radiology;  Laterality: N/A;   TEE WITHOUT CARDIOVERSION N/A 02/11/2022   Procedure: TRANSESOPHAGEAL ECHOCARDIOGRAM (TEE);  Surgeon: Lonni Slain,  MD;  Location: [facility] ENDOSCOPY;  Service: Cardiovascular;  Laterality: N/A;          Physical Exam:  Today's Vitals   07/16/24 1545  BP: 128/78  Pulse: 61  Weight: 196 lb (88.9 kg)  Height: 5' 6 (1.676 m)   Body mass index is 31.64 kg/m.   General: well developed, well nourished, very pleasant middle-age female, seated, in no evident distress  Neurologic Exam Mental Status: Awake and fully alert.  Fluent speech and language.  Oriented to place and time. Recent and remote memory intact. Attention span, concentration and  fund of knowledge appropriate. Mood and affect appropriate.  Cranial Nerves: Pupils equal, briskly reactive to light. Extraocular movements full without nystagmus. Visual fields full to confrontation. Hearing intact. Facial sensation intact. Face, tongue, palate moves normally and symmetrically.  Motor: Normal bulk and tone. Normal strength in all tested extremity muscles Sensory.: intact to touch , pinprick , position and vibratory sensation.  Coordination: Rapid alternating movements normal in all extremities. Finger-to-nose and heel-to-shin performed accurately bilaterally. Gait and Station: Arises from chair without difficulty. Stance is normal. Gait demonstrates normal stride length and balance without use of AD.       IMPRESSION: 38 year old female with a history of SAH 2/2 ACA aneurysm rupture s/p coil embolization 11/08/21, right ICA ophthalmic segment aneurysm, embolic strokes 7976 (b/l cerebellar, L MCA and MCA/ACA) possibly related to diagnostic angiogram 3 days prior. Episode of left arm, hand invasion numbness 01/2022 possibly migraine equivalent.      PLAN:  Chronic migraine headaches Currently more associated around menstrual cycle Recommend use of Nurtec as needed, provided sample today. If no benefit, consider trial of Ubrelvy or Zavzpret.  Triptans contraindicated due to history of stroke Start zofran  as needed for nausea associated with migraine Discussed nonpharmacological measures for migraine prevention  History of embolic stroke Hx of SAH 2/2 cerebral aneurysm s/p embolization Cerebral aneurysms R ICA opthalmic segment aneurysm Will request input from Dr. Rosemarie regarding need of aspirin  after post procedural stroke with history of SAH Will also discuss repeat imaging, documented history of iodine contrast allergy. May need to obtain MRA instead or will refer to IR to reestablish care CTA head/neck 01/2022  s/p coiling of dominant portion of A-comm aneurysm, unchanged  3 mm aneurysm at left A1/A-comm junction and 2 mm fusiform a-comm dilatation  ADDENDUM 07/19/2024: Dr. Rosemarie recommends initiating aspirin  81 mg daily for secondary stroke prevention measures as well as obtaining repeat imaging for monitoring of cerebral aneurysms. This will be completed via MRA due to contrast allergy with CTA.      Follow up in 1 year or call earlier if needed     I personally spent a total of 45 minutes in the care of the patient today including preparing to see the patient, getting/reviewing separately obtained history, performing a medically appropriate exam/evaluation, counseling and educating, placing orders, referring and communicating with other health care professionals, and documenting clinical information in the EHR.  Harlene Bogaert, AGNP-BC  [facility] [address] [place], [place] [address]  Phone [phone] Fax [phone] Note: This document was prepared with digital dictation and possible smart phrase technology. Any transcriptional errors that result from this process are unintentional.

## 2024-07-16 NOTE — Progress Notes (Signed)
 I agree with the above plan

## 2024-07-17 NOTE — Progress Notes (Signed)
 Would MRA be appropriate as she has allergy to iodine contrast or should she do the contrast allergy protocol to get the CTA?

## 2024-07-17 NOTE — Progress Notes (Signed)
 I agree with the above plan

## 2024-07-19 ENCOUNTER — Encounter: Payer: Self-pay | Admitting: Adult Health

## 2024-07-19 ENCOUNTER — Telehealth: Payer: Self-pay | Admitting: Adult Health

## 2024-07-19 MED ORDER — ASPIRIN 81 MG PO TBEC: 81.0000 mg | DELAYED_RELEASE_TABLET | Freq: Every day | ORAL | Status: AC

## 2024-07-19 NOTE — Telephone Encounter (Signed)
 sent to [facility] to schedule [phone]

## 2024-07-19 NOTE — Addendum Note (Signed)
 Addended by: WHITFIELD RAISIN L on: 07/19/2024 10:52 AM   Modules accepted: Orders

## 2024-07-23 NOTE — Telephone Encounter (Signed)
 Can you contact imaging to provide them with this information? Contacted IR who provided device numbers for 3 aneurysm coils. Thank you!  #1 Coil device identifier [id number]; device identifier type: GS1 # 2 Coil device identifier [id number]; device identifier type: GS1 # 3 Coil device identifier [id number]; device identifier type: GS1

## 2024-07-23 NOTE — Telephone Encounter (Signed)
 Information for devices in epic via recent MyChart message from patient. This information was obtained from IR. Was sent to you both as I was not sure who should be contacting imaging to update them on this information. Thank you.

## 2024-07-23 NOTE — Telephone Encounter (Signed)
 Either of you know what this is?

## 2024-07-23 NOTE — Telephone Encounter (Signed)
 I faxed them this info from [facility]: Can you contact imaging to provide them with this information? Contacted IR who provided device numbers for 3 aneurysm coils. Thank you!   #1 Coil device identifier [id number]; device identifier type: GS1 # 2 Coil device identifier [id number]; device identifier type: GS1 # 3 Coil device identifier [id number]; device identifier type: GS1

## 2024-07-23 NOTE — Telephone Encounter (Signed)
 [facility]) asking if Debbie Armstrong has implanted medical device card for aneurysms coiling . Will need to be able to schedule patient's appointment Can contact [phone] fax: [phone], also can email to secure email address: [email or web address]

## 2024-07-23 NOTE — Telephone Encounter (Signed)
 Noted, Debbie Armstrong has addressed.

## 2024-07-25 NOTE — Telephone Encounter (Signed)
 I received a fax from [facility] that she is schedule for the MRI on 08/01/24 at 4pm at [facility] in [place].

## 2024-08-01 ENCOUNTER — Telehealth: Payer: Self-pay | Admitting: Adult Health

## 2024-08-01 NOTE — Telephone Encounter (Signed)
 Pt dropped off FMLA paperwork today and was advised 7-14 business days to complete. She wanted as soon as possible due to paperwork being issues 12/17. Put in [employer] Paid $50

## 2024-08-06 DIAGNOSIS — Z0289 Encounter for other administrative examinations: Secondary | ICD-10-CM

## 2024-08-07 NOTE — Telephone Encounter (Signed)
 Signed and placed in pod 3.

## 2024-08-07 NOTE — Telephone Encounter (Signed)
 FMLA form completed.  To JM/NP to sign.

## 2024-08-09 ENCOUNTER — Telehealth: Payer: Self-pay | Admitting: *Deleted

## 2024-08-09 NOTE — Telephone Encounter (Signed)
 FMLA form complete, signed by provider and given to medical records to fax today.

## 2024-08-09 NOTE — Telephone Encounter (Signed)
 Pt [employer] form faxed 08/09/2024 its a copy @ the front desk for the pt.

## 2024-08-09 NOTE — Telephone Encounter (Signed)
 See other phone note. Geni gave FMLA to medical records to process.

## 2024-08-14 NOTE — Telephone Encounter (Signed)
 Called patient back and told her I was unable to see any results at the moment.  Patient stated the center where she had gotten the MRI told her the results were finalized and that she should call us .

## 2024-08-14 NOTE — Telephone Encounter (Signed)
Pt called wanting to know if her results have come in . Please advise. 

## 2024-08-16 ENCOUNTER — Telehealth: Payer: Self-pay | Admitting: *Deleted

## 2024-08-16 DIAGNOSIS — I671 Cerebral aneurysm, nonruptured: Secondary | ICD-10-CM

## 2024-08-16 DIAGNOSIS — I607 Nontraumatic subarachnoid hemorrhage from unspecified intracranial artery: Secondary | ICD-10-CM

## 2024-08-16 DIAGNOSIS — Z8679 Personal history of other diseases of the circulatory system: Secondary | ICD-10-CM

## 2024-08-16 NOTE — Telephone Encounter (Signed)
 Received MR angio brain results. Exam date 08/01/24. Results placed in Jessica's office for review.

## 2024-08-16 NOTE — Telephone Encounter (Signed)
 MRA head completed 08/01/2024 through [facility] with results noted below.  She was previously followed by IR Dr. Lizzie.  Will refer back to IR neurosurgery Dr. Lester for ongoing monitoring and further evaluation if indicated.  CTA was not completed due to contrast allergy.  Could consider pursuing with contrast allergy protocol but will defer to IR if this is indicated.   1.  Suboptimal examination secondary to poor signal, artifact, and absence of priors for comparison. Dedicated CTA could further characterize if clinically indicated.  2.  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  3.  Right A1/A2 segment and anterior communicating artery junction aneurysm measuring 4 mm.  4.  Flow suggested at base of left ACA A1/A2 junction aneurysm measuring 5 mm may reflect compacted endovascular coils.  5.  Outpouching at the proximal right ophthalmic segment measuring 2 mm, poorly evaluated though could represent additional aneurysm.

## 2024-08-16 NOTE — Addendum Note (Signed)
 Addended by: WHITFIELD RAISIN L on: 08/16/2024 03:03 PM   Modules accepted: Orders

## 2024-08-21 NOTE — Telephone Encounter (Addendum)
 Spoke with Harlene and called patient. Relayed to patient that MRA shows there may be some changes to aneurysms but difficult to say with this imaging technique. Referring to Dr Lester w/ Neurosurgery/IR for further evaluation and discussion for how to proceed. ?May need CT-A. Patient verbalized understanding and appreciation. She knows she will be seeing Dr Lester next week.

## 2024-08-24 ENCOUNTER — Inpatient Hospital Stay
Admission: RE | Admit: 2024-08-24 | Discharge: 2024-08-24 | Disposition: A | Payer: Self-pay | Source: Ambulatory Visit | Attending: Neurosurgery | Admitting: Neurosurgery

## 2024-08-24 ENCOUNTER — Other Ambulatory Visit: Payer: Self-pay

## 2024-08-24 DIAGNOSIS — J069 Acute upper respiratory infection, unspecified: Secondary | ICD-10-CM | POA: Insufficient documentation

## 2024-08-24 DIAGNOSIS — F419 Anxiety disorder, unspecified: Secondary | ICD-10-CM | POA: Insufficient documentation

## 2024-08-24 DIAGNOSIS — D259 Leiomyoma of uterus, unspecified: Secondary | ICD-10-CM | POA: Insufficient documentation

## 2024-08-24 DIAGNOSIS — Z049 Encounter for examination and observation for unspecified reason: Secondary | ICD-10-CM

## 2024-08-24 DIAGNOSIS — B009 Herpesviral infection, unspecified: Secondary | ICD-10-CM | POA: Insufficient documentation

## 2024-08-24 DIAGNOSIS — N945 Secondary dysmenorrhea: Secondary | ICD-10-CM | POA: Insufficient documentation

## 2024-08-24 DIAGNOSIS — M940 Chondrocostal junction syndrome [Tietze]: Secondary | ICD-10-CM | POA: Insufficient documentation

## 2024-08-24 DIAGNOSIS — F32A Depression, unspecified: Secondary | ICD-10-CM | POA: Insufficient documentation

## 2024-08-24 DIAGNOSIS — G4700 Insomnia, unspecified: Secondary | ICD-10-CM | POA: Insufficient documentation

## 2024-08-24 DIAGNOSIS — R42 Dizziness and giddiness: Secondary | ICD-10-CM | POA: Insufficient documentation

## 2024-08-24 DIAGNOSIS — R109 Unspecified abdominal pain: Secondary | ICD-10-CM | POA: Insufficient documentation

## 2024-08-24 DIAGNOSIS — N979 Female infertility, unspecified: Secondary | ICD-10-CM | POA: Insufficient documentation

## 2024-08-24 DIAGNOSIS — J9801 Acute bronchospasm: Secondary | ICD-10-CM | POA: Insufficient documentation

## 2024-08-24 DIAGNOSIS — Z6834 Body mass index (BMI) 34.0-34.9, adult: Secondary | ICD-10-CM | POA: Insufficient documentation

## 2024-08-26 NOTE — Progress Notes (Signed)
"  Subjective:    Patient ID:  The patient is a 39 y.o. for a TB skin test.                                                                               Objective:      Assessment/Plan:    PPD read completed and is NEGATIVE.  Results and recommendations reviewed with and acknowledged by the patient in accordance with [facility] Guidelines.          "

## 2024-08-30 ENCOUNTER — Encounter: Payer: Self-pay | Admitting: Neuroradiology

## 2024-08-30 ENCOUNTER — Ambulatory Visit: Admitting: Neuroradiology

## 2024-08-30 ENCOUNTER — Other Ambulatory Visit (HOSPITAL_COMMUNITY): Payer: Self-pay | Admitting: Neuroradiology

## 2024-08-30 VITALS — BP 148/91 | HR 71 | Temp 98.0°F | Ht 66.0 in | Wt 189.0 lb

## 2024-08-30 DIAGNOSIS — Z8679 Personal history of other diseases of the circulatory system: Secondary | ICD-10-CM

## 2024-08-30 DIAGNOSIS — Z95828 Presence of other vascular implants and grafts: Secondary | ICD-10-CM

## 2024-08-30 DIAGNOSIS — I671 Cerebral aneurysm, nonruptured: Secondary | ICD-10-CM

## 2024-08-30 MED ORDER — PREDNISONE 50 MG PO TABS: ORAL_TABLET | ORAL | 0 refills | Status: DC

## 2024-08-30 MED ORDER — DIPHENHYDRAMINE HCL 50 MG PO TABS: ORAL_TABLET | ORAL | 0 refills | Status: AC

## 2024-08-30 MED ORDER — PREDNISONE 50 MG PO TABS: ORAL_TABLET | ORAL | 0 refills | Status: AC

## 2024-08-30 NOTE — Progress Notes (Signed)
 I had the pleasure of seeing Debbie Armstrong in the office today for follow-up of a ruptured brain aneurysm.  She had a subarachnoid hemorrhage with coiling of an anterior communicating artery aneurysm on 11/08/2021 by Dr. Evern.  The notes say that there were 3 ACOM aneurysms.  A 3 mm right ophthalmic artery aneurysm was not treated.  Curiously, she has a left supraclinoid ICA agenesis, and hence the right carotid artery supplies both hemispheres.  In reviewing her previous imaging, the aneurysm was complex.  She had a digital subtraction angiogram 02/04/2022 which did not show recurrence.  She most recently had an MRA 08/01/2024 at [facility] which does appear to show a 3 mm recurrence of the anterior communicating artery aneurysm (which was small to begin with).  After her diagnostic arteriogram on 02/04/2022, she actually suffered a stroke.  The MRI curiously shows an infarct in the right cerebellum, not the right carotid territory.  She had a thorough evaluation, including a transesophageal echo which was negative, and a hypercoagulable panel which was unremarkable.  She has a history of migraine headaches, with a hormonal component.  She does not have any other significant past medical history.  No history of hypertension, diabetes or heart disease.  She quit vaping sometime ago and does not smoke tobacco.  Occasional marijuana use.  Blood pressure today was 148/91. Lungs are clear. Heart sounds are normal. Radial pulses are normal and symmetric with normal heart rhythm. Mallampati is 2.  Assessment:  Probable recurrence of anterior communicating artery aneurysm.  It is difficult to be 100% certain based on the MRA.  Recommendation:  Given her history of subarachnoid hemorrhage from this aneurysm, my recommendation is a catheter arteriogram to better define the anatomy and position of previously placed coils.  This would require a three-dimensional picture.  She is understandably anxious  about the catheter arteriogram given her previous experience in the stroke which occurred shortly after.  However, I think the test is necessary to clarify if there is a recurrence of the aneurysm, and if so, to plan a treatment strategy.  There is a small risk of complication from cerebral angiography, and we did discuss this.  I estimated the risk of a stroke around 1 and 500.  I will put her on an aspirin  before the procedure and of course she will be heparinized during the procedure.

## 2024-08-30 NOTE — Progress Notes (Signed)
 Pt does not have any bp meds. Advised pt to speak PCP about it being up.

## 2024-08-30 NOTE — Addendum Note (Signed)
 Addended by: Tom Macpherson on: 08/30/2024 04:10 PM   Modules accepted: Orders

## 2024-09-04 ENCOUNTER — Other Ambulatory Visit: Payer: Self-pay

## 2024-09-04 DIAGNOSIS — I671 Cerebral aneurysm, nonruptured: Secondary | ICD-10-CM

## 2024-09-10 ENCOUNTER — Other Ambulatory Visit (HOSPITAL_COMMUNITY)

## 2025-07-22 ENCOUNTER — Ambulatory Visit: Admitting: Adult Health
